# Patient Record
Sex: Male | Born: 1935 | Race: White | Hispanic: No | Smoking: Never smoker
Health system: Southern US, Community
[De-identification: ages and names within clinical notes are randomized; demographics above are authoritative.]

## PROBLEM LIST (undated history)

## (undated) DIAGNOSIS — C73 Malignant neoplasm of thyroid gland: Secondary | ICD-10-CM

## (undated) DIAGNOSIS — S0990XA Unspecified injury of head, initial encounter: Secondary | ICD-10-CM

## (undated) HISTORY — PX: FACIAL COSMETIC SURGERY: SHX629

## (undated) HISTORY — PX: APPENDECTOMY: SHX54

## (undated) HISTORY — DX: Unspecified injury of head, initial encounter: S09.90XA

## (undated) HISTORY — PX: THYROIDECTOMY: SHX17

## (undated) HISTORY — PX: CHOLECYSTECTOMY: SHX55

## (undated) HISTORY — DX: Malignant neoplasm of thyroid gland: C73

---

## 2007-06-11 ENCOUNTER — Emergency Department: Payer: Self-pay | Admitting: Unknown Physician Specialty

## 2007-08-09 ENCOUNTER — Ambulatory Visit: Payer: Self-pay | Admitting: Internal Medicine

## 2007-08-15 ENCOUNTER — Encounter: Payer: Self-pay | Admitting: Internal Medicine

## 2007-09-06 ENCOUNTER — Encounter: Payer: Self-pay | Admitting: Internal Medicine

## 2007-10-07 ENCOUNTER — Encounter: Payer: Self-pay | Admitting: Internal Medicine

## 2007-11-07 ENCOUNTER — Encounter: Payer: Self-pay | Admitting: Internal Medicine

## 2007-12-07 ENCOUNTER — Encounter: Payer: Self-pay | Admitting: Internal Medicine

## 2007-12-11 ENCOUNTER — Ambulatory Visit: Payer: Self-pay | Admitting: Family

## 2008-01-07 ENCOUNTER — Ambulatory Visit: Payer: Self-pay | Admitting: Family

## 2008-02-06 ENCOUNTER — Ambulatory Visit: Payer: Self-pay | Admitting: Family

## 2009-01-08 ENCOUNTER — Ambulatory Visit: Payer: Self-pay | Admitting: Internal Medicine

## 2009-02-14 ENCOUNTER — Ambulatory Visit: Payer: Self-pay | Admitting: Internal Medicine

## 2009-05-07 ENCOUNTER — Ambulatory Visit: Payer: Self-pay | Admitting: Neurology

## 2009-11-05 ENCOUNTER — Ambulatory Visit: Payer: Self-pay | Admitting: Internal Medicine

## 2009-11-12 ENCOUNTER — Encounter: Payer: Self-pay | Admitting: Physical Medicine and Rehabilitation

## 2009-12-01 ENCOUNTER — Emergency Department: Payer: Self-pay | Admitting: Emergency Medicine

## 2009-12-06 ENCOUNTER — Encounter: Payer: Self-pay | Admitting: Physical Medicine and Rehabilitation

## 2009-12-14 ENCOUNTER — Emergency Department: Payer: Self-pay | Admitting: Emergency Medicine

## 2010-01-16 ENCOUNTER — Emergency Department: Payer: Self-pay | Admitting: Emergency Medicine

## 2010-03-02 ENCOUNTER — Emergency Department: Payer: Self-pay | Admitting: Emergency Medicine

## 2010-03-02 ENCOUNTER — Encounter: Payer: Self-pay | Admitting: Cardiovascular Disease

## 2010-03-18 ENCOUNTER — Encounter: Payer: Self-pay | Admitting: Cardiovascular Disease

## 2010-03-24 ENCOUNTER — Encounter: Payer: Self-pay | Admitting: Cardiovascular Disease

## 2010-03-24 ENCOUNTER — Ambulatory Visit
Admission: RE | Admit: 2010-03-24 | Discharge: 2010-03-24 | Payer: Self-pay | Source: Home / Self Care | Attending: Cardiovascular Disease | Admitting: Cardiovascular Disease

## 2010-03-24 DIAGNOSIS — R072 Precordial pain: Secondary | ICD-10-CM | POA: Insufficient documentation

## 2010-04-09 NOTE — Assessment & Plan Note (Signed)
Summary:            Visit Type:  Initial Consult Primary Provider:  Dr. Ronna Polio  CC:  c/o occasional chest pain. Denies SOB and palpitations..  History of Present Illness: Mr. Espinoza is a very pleasant 77 year old gentleman with past history of stroke with residual right-sided deficits, diabetes, hypertension, hyperlipidemia, thyroid cancer with resection, additional TIAs who presents for followup after recent evaluation in the emergency room for chest pain.  He reports that on December 26 he was sitting in a chair and he developed left-sided discomfort over his chest. He never had symptoms like this before. He symptoms were quite significant, did not seem to get worse with exertion. By the time he got to the emergency room, they had almost resolved. He's not had any further episodes since his discharge from the emergency room. Notes indicate he had trouble lifting his left arm in addition to lifting his right. When he lifted his left, he had shoulder pain and scapular pain. EKG was essentially unchanged with heart rate 75 beats per minute, borderline Q waves in inferior leads and lateral leads. Blood work was essentially normal with negative cardiac enzymes.  He reports that he had a stress test with Dr. Juel Burrow. this was a treadmill study and he was told that it was normal. Since his stroke, he has not had any stress test. He is uncertain if he has had a carotid ultrasound after his stroke though reports significant workup at [facility].  EKG shows normal sinus rhythm with rate 90 beats per minute, borderline Q waves in anterolateral leads, essentially unchanged from previous EKG        Preventive Screening-Counseling & Management  Caffeine-Diet-Exercise     Does Patient Exercise: no      Drug Use:  no.    Current Medications (verified): 1)  Levothyroxine Sodium 100 Mcg Tabs (Levothyroxine Sodium) .Marland Kitchen.. 1 Tablet Daily 2)  Ramipril 2.5 Mg Caps (Ramipril) .Marland Kitchen.. 1 Tablet  Daily 3)  Simvastatin 10 Mg Tabs (Simvastatin) .Marland Kitchen.. 1 Tablet Daily 4)  Plavix 75 Mg Tabs (Clopidogrel Bisulfate) .Marland Kitchen.. 1 Tablet Daily 5)  Aspirin 325 Mg Tabs (Aspirin) .Marland Kitchen.. 1 Tablet Daily 6)  Nasonex 50 Mcg/act Susp (Mometasone Furoate) .Marland Kitchen.. 1 Spray Daily 7)  B Complex-B12  Tabs (B Complex Vitamins) .Marland Kitchen.. 1 Tablet Daily 8)  Vitamin C 100 Mg Tabs (Ascorbic Acid) .Marland Kitchen.. 1 Tablet Daily 9)  Vitamin C Cr 1000 Mg Cr-Tabs (Ascorbic Acid) .Marland Kitchen.. 1 Tablet Daily 10)  Vitamin D 400 Unit Tabs (Cholecalciferol) .Marland Kitchen.. 1 Tablet Daily 11)  Cinnamon 500 Mg Caps (Cinnamon) .Marland Kitchen.. 1 Tablet Daily 12)  Multivitamins  Tabs (Multiple Vitamin) .Marland Kitchen.. 1 Tablet Daily 13)  Fish Oil 1000 Mg Caps (Omega-3 Fatty Acids) .Marland Kitchen.. 1 Tablet Daily  Allergies (verified): 1)  ! Sulfa 2)  ! Penicillin  Past History:  Family History: Last updated: 2010/03/31 Father: Heart attack-deceased Mother: Cancer-deceased  Social History: Last updated: 2010/03/31 Tobacco Use - No.  Alcohol Use - no Regular Exercise - no Drug Use - no Retired  Widowed   Risk Factors: Exercise: no (03-31-2010)  Risk Factors: Smoking Status: never (03/20/2010)  Past Medical History: Diabetes  Past Surgical History: Thyroidectomy Appendectomy Cholecystectomy Facial surgery  Family History: Father: Heart attack-deceased Mother: Cancer-deceased  Social History: Tobacco Use - No.  Alcohol Use - no Regular Exercise - no Drug Use - no Retired  Widowed  Does Patient Exercise:  no Drug Use:  no  Review of Systems  The patient complains of difficulty walking.  The patient denies fever, weight loss, weight gain, vision loss, decreased hearing, hoarseness, chest pain, syncope, dyspnea on exertion, peripheral edema, prolonged cough, abdominal pain, incontinence, muscle weakness, depression, and enlarged lymph nodes.         Right side arm and leg weakness, mild speech impediment  Vital Signs:  Patient profile:   77 year old male Height:       67 inches Weight:      151.75 pounds BMI:     23.85 Pulse rate:   90 / minute BP sitting:   165 / 97  (left arm) Cuff size:   regular  Vitals Entered By: Lysbeth Galas CMA (March 24, 2010 3:35 PM)  Physical Exam  General:  elderly gentleman who walks slowly with a walker, no apparent distress Head:  normocephalic and atraumatic Neck:  Neck supple, no JVD. No masses, thyromegaly or abnormal cervical nodes. Lungs:  Clear bilaterally to auscultation and percussion. Heart:  Non-displaced PMI, chest non-tender; regular rate and rhythm, S1, S2 without murmurs, rubs or gallops. Carotid upstroke normal, no bruit. Normal abdominal aortic size, no bruits.  Pedals normal pulses. No edema, no varicosities. Abdomen:  Bowel sounds positive; abdomen soft and non-tender without masses Msk:  Back normal, normal gait. Muscle strength and tone normal. Pulses:  pulses normal in all 4 extremities Extremities:  No clubbing or cyanosis. Neurologic:  Alert and oriented x 3. Skin:  Intact without lesions or rashes. Psych:  Normal affect.   Impression & Recommendations:  Problem # 1:  CHEST PAIN-PRECORDIAL (ICD-786.51) one episode of chest pain on December 26 of uncertain etiology. EKG and blood work was essentially normal when seen in the emergency room. He did have a stress test in the recent past but the details are unavailable. We have suggested that we could perform a lexiscan Myoview if he would like either now or with additional episodes of chest discomfort. He talked with his daughter and he would like to hold off for now. We have suggested if he has additional episodes of chest pain, that he contact us for a stress test.  He is currently on aspirin and Plavix. I would recommend aggressive cholesterol management given his history of stroke and TIA. Blood pressure is well controlled on today's visit.  His updated medication list for this problem includes:    Ramipril 2.5 Mg Caps (Ramipril)  .Marland Kitchen... 1 tablet daily    Plavix 75 Mg Tabs (Clopidogrel bisulfate) .Marland Kitchen... 1 tablet daily    Aspirin 325 Mg Tabs (Aspirin) .Marland Kitchen... 1 tablet daily  Problem # 2:  PREVENTIVE HEALTH CARE (ICD-V70.0) Would recommend continued exercise to maintain his strength and independence. We have discussed this with him. He would try to continue exercises at the physical therapist and given to him. Continue aspirin and Plavix, cholesterol treatment.  Other Orders: EKG w/ Interpretation (93000)  Patient Instructions: 1)  Your physician recommends that you schedule a follow-up appointment in: 1 year 2)  Your physician recommends that you continue on your current medications as directed. Please refer to the Current Medication list given to you today.

## 2010-04-15 NOTE — Consult Note (Signed)
SummaryScientist, physiological [facility]   [facility]   Imported By: Roderic Ovens 04/10/2010 14:54:57  _____________________________________________________________________  External Attachment:    Type:   Image     Comment:   External Document

## 2010-04-15 NOTE — Letter (Signed)
SummarySecondary school teacher Office Visit Note   [facility] Office Visit Note   Imported By: Roderic Ovens 04/10/2010 14:25:47  _____________________________________________________________________  External Attachment:    Type:   Image     Comment:   External Document

## 2010-04-15 NOTE — Letter (Signed)
SummaryScientist, physiological [facility]   [facility]   Imported By: Roderic Ovens 04/10/2010 15:06:11  _____________________________________________________________________  External Attachment:    Type:   Image     Comment:   External Document

## 2010-09-23 ENCOUNTER — Encounter: Payer: Self-pay | Admitting: Cardiovascular Disease

## 2010-10-27 ENCOUNTER — Encounter: Payer: Self-pay | Admitting: Internal Medicine

## 2010-10-28 ENCOUNTER — Ambulatory Visit (INDEPENDENT_AMBULATORY_CARE_PROVIDER_SITE_OTHER): Payer: Medicare Other | Admitting: Internal Medicine

## 2010-10-28 ENCOUNTER — Encounter: Payer: Self-pay | Admitting: Internal Medicine

## 2010-10-28 VITALS — BP 157/89 | HR 68 | Temp 97.9°F | Resp 12 | Ht 68.0 in | Wt 154.0 lb

## 2010-10-28 DIAGNOSIS — I10 Essential (primary) hypertension: Secondary | ICD-10-CM

## 2010-10-28 DIAGNOSIS — R296 Repeated falls: Secondary | ICD-10-CM

## 2010-10-28 DIAGNOSIS — E119 Type 2 diabetes mellitus without complications: Secondary | ICD-10-CM | POA: Insufficient documentation

## 2010-10-28 DIAGNOSIS — Z9181 History of falling: Secondary | ICD-10-CM

## 2010-10-28 DIAGNOSIS — E785 Hyperlipidemia, unspecified: Secondary | ICD-10-CM

## 2010-10-28 DIAGNOSIS — I7090 Unspecified atherosclerosis: Secondary | ICD-10-CM | POA: Insufficient documentation

## 2010-10-28 MED ORDER — CLOPIDOGREL BISULFATE 75 MG PO TABS: 75.0000 mg | ORAL_TABLET | Freq: Every day | ORAL | Status: DC

## 2010-10-28 MED ORDER — METFORMIN HCL 500 MG PO TABS: 500.0000 mg | ORAL_TABLET | Freq: Two times a day (BID) | ORAL | Status: DC

## 2010-10-28 MED ORDER — RAMIPRIL 2.5 MG PO CAPS: 2.5000 mg | ORAL_CAPSULE | Freq: Every day | ORAL | Status: DC

## 2010-10-28 MED ORDER — SIMVASTATIN 10 MG PO TABS
10.0000 mg | ORAL_TABLET | Freq: Every day | ORAL | Status: DC
Start: 2010-10-28 — End: 2011-11-23

## 2010-10-28 NOTE — Patient Instructions (Signed)
Follow up in 3 months and earlier if needed.

## 2010-10-28 NOTE — Progress Notes (Signed)
Subjective:    Patient ID: Bradley Marshall, male    DOB: 07-04-1928, 77 y.o.   MRN: [id number]  HPI Bradley Marshall is a 77 year old male with a history stroke resulting in right sided weakness leading to recurrent falls. Since his last visit, during which time he leaned over to pick up something off the floor and fell forward hitting his head and leg on the ground. He did not sustain any injuries from the fall.  His family members have made efforts to prevent falls in his home by limiting obstacles and providing him with a Bradley Marshall. He also has an emergency alert device which he wears around his neck.  He also presents to followup diabetes. He continues to take metformin for his diabetes and reports good compliance with his medication.  He does not check his blood sugars on a regular basis. His daughter checked his blood sugar one time since her last visit and is 139. He denies any symptoms such as diaphoresis or lightheadedness to suggest low blood sugars.  Outpatient Encounter Prescriptions as of 10/28/2010  Medication Sig Dispense Refill  . aspirin 325 MG tablet Take 325 mg by mouth daily.        . B Complex Vitamins (B COMPLEX-B12) TABS Take by mouth daily.        . Cinnamon 500 MG capsule Take 500 mg by mouth daily.        Marland Kitchen levothyroxine (SYNTHROID, LEVOTHROID) 75 MCG tablet Take 75 mcg by mouth daily.        . metFORMIN (GLUCOPHAGE) 500 MG tablet Take 1 tablet (500 mg total) by mouth 2 (two) times daily.  180 tablet  4  . mometasone (NASONEX) 50 MCG/ACT nasal spray Place 1 spray into the nose daily.        . Multiple Vitamins-Minerals (MULTIVITAL) tablet Take 1 tablet by mouth daily.        . Omega-3 Fatty Acids (FISH OIL) 1000 MG CAPS Take by mouth daily.        . ramipril (ALTACE) 2.5 MG capsule Take 1 capsule (2.5 mg total) by mouth daily.  90 capsule  4  . simvastatin (ZOCOR) 10 MG tablet Take 1 tablet (10 mg total) by mouth daily.  90 tablet  4  . vitamin D, CHOLECALCIFEROL, 400 UNITS tablet  Take 400 Units by mouth daily.        Marland Kitchen DISCONTD: clopidogrel (PLAVIX) 75 MG tablet Take 75 mg by mouth daily.        Marland Kitchen DISCONTD: metFORMIN (GLUCOPHAGE) 500 MG tablet Take 500 mg by mouth Twice daily.      Marland Kitchen DISCONTD: ramipril (ALTACE) 2.5 MG capsule Take 2.5 mg by mouth daily.        Marland Kitchen DISCONTD: simvastatin (ZOCOR) 10 MG tablet Take 10 mg by mouth daily.        . Ascorbic Acid (VITAMIN C) 100 MG tablet Take 100 mg by mouth daily.        . clopidogrel (PLAVIX) 75 MG tablet Take 1 tablet (75 mg total) by mouth daily.  90 tablet  4  . DISCONTD: Ascorbic Acid (VITAMIN C CR) 1000 MG TBCR Take by mouth daily.        Marland Kitchen DISCONTD: clopidogrel (PLAVIX) 75 MG tablet Take 75 mg by mouth Daily.      Marland Kitchen DISCONTD: Levothyroxine Sodium 100 MCG CAPS Take by mouth daily.           Review of Systems  Constitutional: Negative for fever, chills, activity  change, appetite change, fatigue and unexpected weight change.  Eyes: Negative for visual disturbance.  Respiratory: Negative for cough and shortness of breath.   Cardiovascular: Negative for chest pain, palpitations and leg swelling.  Gastrointestinal: Negative for abdominal pain and abdominal distention.  Genitourinary: Negative for dysuria, urgency and difficulty urinating.  Musculoskeletal: Positive for gait problem. Negative for myalgias and arthralgias.  Skin: Negative for color change and rash.  Neurological: Positive for speech difficulty and weakness (right sided).  Hematological: Negative for adenopathy.  Psychiatric/Behavioral: Negative for confusion, sleep disturbance, dysphoric mood and decreased concentration. The patient is not nervous/anxious.     BP 157/89  Pulse 68  Temp(Src) 97.9 F (36.6 C) (Oral)  Resp 12  Ht 5\' 8"  (1.727 m)  Wt 154 lb (69.854 kg)  BMI 23.42 kg/m2  SpO2 98%     Objective:   Physical Exam  Constitutional: He is oriented to person, place, and time. He appears well-developed and well-nourished. No distress.    HENT:  Head: Normocephalic and atraumatic.  Nose: Nose normal.  Mouth/Throat: Oropharynx is clear and moist. No oropharyngeal exudate.  Eyes: Conjunctivae and EOM are normal. Pupils are equal, round, and reactive to light. Right eye exhibits no discharge. Left eye exhibits no discharge. No scleral icterus.  Neck: Normal range of motion. Neck supple. No tracheal deviation present. No thyromegaly present.  Cardiovascular: Normal rate, regular rhythm and normal heart sounds.  Exam reveals no gallop and no friction rub.   No murmur heard. Pulmonary/Chest: Effort normal and breath sounds normal. No respiratory distress. He has no wheezes. He has no rales. He exhibits no tenderness.  Musculoskeletal: Normal range of motion. He exhibits no edema.       Decreased strength RUE   Lymphadenopathy:    He has no cervical adenopathy.  Neurological: He is alert and oriented to person, place, and time. No cranial nerve deficit. Coordination and gait abnormal.  Skin: Skin is warm and dry. No rash noted. He is not diaphoretic. There is erythema (right lateral knee c/w ecchymoses). No pallor.  Psychiatric: He has a normal mood and affect. His behavior is normal. Judgment and thought content normal.          Assessment & Plan:  1. Recurrent falls - Bradley Marshall has a history of stroke which has led to right-sided weakness and recurrent falls. He has done fairly well recently with only one fall from a standing position with no resulting injury. His family has made great efforts to ensure that his home is obstacle free and he uses a Bradley Marshall to help prevent falls. He has had physical and occupational therapy in the past. He refuses additional therapy at this time. We will continue to monitor for now. He will continue to use his Bradley Marshall and keep his emergency alert device in place.  2. Diabetes - Well-controlled on current medications. Will check hemoglobin A1c with labs. We'll request recent labs from [facility]. We'll plan for him to followup in 3 months.  3. Hyperlipidemia - well-controlled on simvastatin 10 mg daily. Will continue this medication and repeat liver function tests and lipids in 3 months.

## 2010-11-24 ENCOUNTER — Telehealth: Payer: Self-pay | Admitting: Internal Medicine

## 2010-11-24 NOTE — Telephone Encounter (Signed)
Please set him up with [facility] for evaluation and likely physical therapy. Thanks

## 2010-11-24 NOTE — Telephone Encounter (Signed)
Pt daughter called she would like to get Bradley Marshall set up with home health care.  He is falling more.  He is falling at night when no one is there with him

## 2010-11-24 NOTE — Telephone Encounter (Signed)
Called Sherrilyn Rist and she is going to stop by tomorrow to pickup paperwork.

## 2010-11-27 ENCOUNTER — Emergency Department: Payer: Self-pay | Admitting: Internal Medicine

## 2010-12-01 ENCOUNTER — Telehealth: Payer: Self-pay | Admitting: Internal Medicine

## 2010-12-01 NOTE — Telephone Encounter (Signed)
Physical Therapist from [facility] called he needs orders to continue 2x  A week for another 2-3 weeks.  Please advise.

## 2010-12-01 NOTE — Telephone Encounter (Signed)
That is fine 

## 2010-12-02 NOTE — Telephone Encounter (Signed)
Informed Physical Therapist that it was fine to continue physical therapy.  He just needed a verbal.

## 2011-01-18 ENCOUNTER — Ambulatory Visit (INDEPENDENT_AMBULATORY_CARE_PROVIDER_SITE_OTHER): Payer: Medicare Other | Admitting: Internal Medicine

## 2011-01-18 ENCOUNTER — Telehealth: Payer: Self-pay | Admitting: Internal Medicine

## 2011-01-18 ENCOUNTER — Ambulatory Visit: Payer: Self-pay | Admitting: Internal Medicine

## 2011-01-18 ENCOUNTER — Encounter: Payer: Self-pay | Admitting: Internal Medicine

## 2011-01-18 VITALS — BP 136/66 | HR 91 | Temp 97.7°F | Wt 158.0 lb

## 2011-01-18 DIAGNOSIS — E785 Hyperlipidemia, unspecified: Secondary | ICD-10-CM

## 2011-01-18 DIAGNOSIS — I672 Cerebral atherosclerosis: Secondary | ICD-10-CM

## 2011-01-18 DIAGNOSIS — E039 Hypothyroidism, unspecified: Secondary | ICD-10-CM

## 2011-01-18 DIAGNOSIS — R0989 Other specified symptoms and signs involving the circulatory and respiratory systems: Secondary | ICD-10-CM

## 2011-01-18 DIAGNOSIS — I509 Heart failure, unspecified: Secondary | ICD-10-CM

## 2011-01-18 DIAGNOSIS — R05 Cough: Secondary | ICD-10-CM

## 2011-01-18 DIAGNOSIS — I10 Essential (primary) hypertension: Secondary | ICD-10-CM

## 2011-01-18 DIAGNOSIS — R059 Cough, unspecified: Secondary | ICD-10-CM

## 2011-01-18 DIAGNOSIS — E119 Type 2 diabetes mellitus without complications: Secondary | ICD-10-CM

## 2011-01-18 DIAGNOSIS — R0600 Dyspnea, unspecified: Secondary | ICD-10-CM

## 2011-01-18 NOTE — Progress Notes (Signed)
Subjective:    Patient ID: Bradley Marshall, male    DOB: 02-01-1929, 77 y.o.   MRN: [id number]  HPI 77YO male with h/o stroke presents for acute visit c/o several days of dyspnea and wheezing. Non-productive cough. No congestion, sore throat, fever, chills.  Notes recent weight gain of about 5lbs.  No fever, chills. No known sick contacts. No chest pain or palpitations.  Daughter also notes some progression of right arm weakness and possible left arm weakness. They question whether he might be having progression of stroke or new stroke. 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  Outpatient Encounter Prescriptions as of 01/18/2011  Medication Sig Dispense Refill  . Ascorbic Acid (VITAMIN C) 100 MG tablet Take 100 mg by mouth daily.        Marland Kitchen aspirin 325 MG tablet Take 325 mg by mouth daily.        . B Complex Vitamins (B COMPLEX-B12) TABS Take by mouth daily.        . Cinnamon 500 MG capsule Take 500 mg by mouth daily.        . clopidogrel (PLAVIX) 75 MG tablet Take 1 tablet (75 mg total) by mouth daily.  90 tablet  4  . levothyroxine (SYNTHROID, LEVOTHROID) 75 MCG tablet Take 75 mcg by mouth daily.        . metFORMIN (GLUCOPHAGE) 500 MG tablet Take 1 tablet (500 mg total) by mouth 2 (two) times daily.  180 tablet  4  . mometasone (NASONEX) 50 MCG/ACT nasal spray Place 1 spray into the nose daily.        . Multiple Vitamins-Minerals (MULTIVITAL) tablet Take 1 tablet by mouth daily.        . Omega-3 Fatty Acids (FISH OIL) 1000 MG CAPS Take by mouth daily.        . ramipril (ALTACE) 2.5 MG capsule Take 1 capsule (2.5 mg total) by mouth daily.  90 capsule  4  . simvastatin (ZOCOR) 10 MG tablet Take 1 tablet (10 mg total) by mouth daily.  90 tablet  4  . vitamin D, CHOLECALCIFEROL, 400 UNITS tablet Take 400 Units by mouth daily.          Review of Systems  Constitutional: Negative for fever, chills, activity change, appetite change, fatigue and unexpected weight change.  Eyes:  Negative for visual disturbance.  Respiratory: Positive for shortness of breath and wheezing. Negative for cough.   Cardiovascular: Negative for chest pain, palpitations and leg swelling.  Gastrointestinal: Negative for abdominal pain and abdominal distention.  Genitourinary: Negative for dysuria, urgency and difficulty urinating.  Musculoskeletal: Positive for gait problem. Negative for arthralgias.  Skin: Negative for color change and rash.  Neurological: Positive for weakness.  Hematological: Negative for adenopathy.  Psychiatric/Behavioral: Negative for sleep disturbance and dysphoric mood. The patient is not nervous/anxious.    BP 136/66  Pulse 91  Temp(Src) 97.7 F (36.5 C) (Oral)  Wt 158 lb (71.668 kg)  SpO2 97%     Objective:   Physical Exam  Constitutional: He is oriented to person, place, and time. He appears well-developed and well-nourished. No distress.  HENT:  Head: Normocephalic and atraumatic.  Right Ear: External ear normal.  Left Ear: External ear normal.  Nose: Nose normal.  Mouth/Throat: Oropharynx is clear and moist. No oropharyngeal exudate.  Eyes: Conjunctivae and EOM are normal. Pupils are equal, round, and reactive to light. Right eye exhibits no discharge. Left eye exhibits no discharge. No scleral icterus.  Neck: Normal range of motion. Neck supple. No tracheal deviation present. No thyromegaly present.  Cardiovascular: Normal rate, regular rhythm and normal heart sounds.  Exam reveals no gallop and no friction rub.   No murmur heard. Pulmonary/Chest: Effort normal. No respiratory distress. He has no wheezes. He has rales (bilateral bases). He exhibits no tenderness.  Musculoskeletal: Normal range of motion. He exhibits no edema.  Lymphadenopathy:    He has no cervical adenopathy.  Neurological: He is alert and oriented to person, place, and time. No cranial nerve deficit. He exhibits abnormal muscle tone (RUE weakness). Coordination normal.  Skin: Skin  is warm and dry. No rash noted. He is not diaphoretic. No erythema. No pallor.  Psychiatric: He has a normal mood and affect. His behavior is normal. Judgment and thought content normal.    CXR: no acute changes      Assessment & Plan:  1. Dyspnea/Cough - Exam is consistent with pulmonary edema.  No fever, congestion, etc to suggest URI. CXR normal. Will try adding Lasix 20mg  daily x 2 days, then recheck.  Follow up 2 days.  2. Cerebral atherosclerosis - Therapy maximized with plavix, aspirin, statin. BP has been well controlled. Will check LDL to make sure at goal <70. DM has been well controlled. Will check A1c with labs today. Discussed possible imaging with daughter. We discussed additional intervention of warfarin, and risks associated with this.  He has had numerous falls, so risk of bleeding on coumadin would be high. For now, will continue current therapy and continue to monitor.

## 2011-01-18 NOTE — Telephone Encounter (Signed)
CXR showed no evidence of infection. I think his wheezing is fluid. Would like to start Lasix 20mg  daily x 2 days. Monitor weight closely. Follow up Thursday.

## 2011-01-18 NOTE — Patient Instructions (Signed)
CXR today. Labs today. We will call with results. Follow up Thursday.

## 2011-01-19 LAB — LIPID PANEL
LDL Cholesterol: 74 mg/dL (ref 0–99)
Total CHOL/HDL Ratio: 3
VLDL: 18.2 mg/dL (ref 0.0–40.0)

## 2011-01-19 LAB — COMPREHENSIVE METABOLIC PANEL
AST: 18 U/L (ref 0–37)
Albumin: 3.8 g/dL (ref 3.5–5.2)
Alkaline Phosphatase: 71 U/L (ref 39–117)
Potassium: 4 mEq/L (ref 3.5–5.1)
Sodium: 140 mEq/L (ref 135–145)
Total Protein: 6.9 g/dL (ref 6.0–8.3)

## 2011-01-19 MED ORDER — FUROSEMIDE 20 MG PO TABS: ORAL_TABLET | ORAL | Status: DC

## 2011-01-19 NOTE — Telephone Encounter (Signed)
Daughter informed, rx sent into local pharm

## 2011-01-19 NOTE — Telephone Encounter (Signed)
Left mess for daughter to call office back.

## 2011-01-20 ENCOUNTER — Other Ambulatory Visit: Payer: Self-pay | Admitting: *Deleted

## 2011-01-20 MED ORDER — LEVOTHYROXINE SODIUM 100 MCG PO TABS: 100.0000 ug | ORAL_TABLET | Freq: Every day | ORAL | Status: DC

## 2011-01-21 ENCOUNTER — Encounter: Payer: Self-pay | Admitting: Internal Medicine

## 2011-01-21 ENCOUNTER — Ambulatory Visit (INDEPENDENT_AMBULATORY_CARE_PROVIDER_SITE_OTHER): Payer: Medicare Other | Admitting: Internal Medicine

## 2011-01-21 VITALS — BP 130/70 | HR 98 | Temp 98.0°F | Wt 161.0 lb

## 2011-01-21 DIAGNOSIS — E039 Hypothyroidism, unspecified: Secondary | ICD-10-CM

## 2011-01-21 DIAGNOSIS — J40 Bronchitis, not specified as acute or chronic: Secondary | ICD-10-CM

## 2011-01-21 MED ORDER — AZITHROMYCIN 250 MG PO TABS: ORAL_TABLET | ORAL | Status: AC

## 2011-01-21 NOTE — Patient Instructions (Signed)
Stop Lasix. Start Azithromycin. Call Monday to give update in condition.

## 2011-01-21 NOTE — Progress Notes (Signed)
Subjective:    Patient ID: Bradley Marshall, male    DOB: 02-02-1929, 77 y.o.   MRN: [id number]  HPI 77YO male with h/o hypothyroidism and recent 2 week h/o cough presents for follow up. CXR after visit earlier this week was normal. Pt was treated with lasix for suspected pulm edema, however cough unchanged. Cough is non-productive. No fever, chills. Generally weak. No sick contacts. Labs at last visit also showed elevated TSH. Synthroid was increased to daily, but pt has not started yet.  Outpatient Encounter Prescriptions as of 01/21/2011  Medication Sig Dispense Refill  . Ascorbic Acid (VITAMIN C) 100 MG tablet Take 100 mg by mouth daily.        Marland Kitchen aspirin 325 MG tablet Take 325 mg by mouth daily.        . B Complex Vitamins (B COMPLEX-B12) TABS Take by mouth daily.        . Cinnamon 500 MG capsule Take 500 mg by mouth daily.        . clopidogrel (PLAVIX) 75 MG tablet Take 1 tablet (75 mg total) by mouth daily.  90 tablet  4  . furosemide (LASIX) 20 MG tablet Take 1 tablet daily x 2 days, then discuss with physician at visit  30 tablet  0  . levothyroxine (LEVOTHROID) 100 MCG tablet Take 1 tablet (100 mcg total) by mouth daily.  90 tablet  1  . metFORMIN (GLUCOPHAGE) 500 MG tablet Take 1 tablet (500 mg total) by mouth 2 (two) times daily.  180 tablet  4  . mometasone (NASONEX) 50 MCG/ACT nasal spray Place 1 spray into the nose daily.        . Multiple Vitamins-Minerals (MULTIVITAL) tablet Take 1 tablet by mouth daily.        . Omega-3 Fatty Acids (FISH OIL) 1000 MG CAPS Take by mouth daily.        . ramipril (ALTACE) 2.5 MG capsule Take 1 capsule (2.5 mg total) by mouth daily.  90 capsule  4  . simvastatin (ZOCOR) 10 MG tablet Take 1 tablet (10 mg total) by mouth daily.  90 tablet  4  . vitamin D, CHOLECALCIFEROL, 400 UNITS tablet Take 400 Units by mouth daily.          Review of Systems  Constitutional: Negative for fever, chills, activity change, appetite change, fatigue and  unexpected weight change.  Eyes: Negative for visual disturbance.  Respiratory: Positive for cough. Negative for shortness of breath.   Cardiovascular: Negative for chest pain, palpitations and leg swelling.  Gastrointestinal: Negative for abdominal pain and abdominal distention.  Genitourinary: Negative for dysuria, urgency and difficulty urinating.  Musculoskeletal: Negative for arthralgias and gait problem.  Skin: Negative for color change and rash.  Neurological: Positive for weakness.  Hematological: Negative for adenopathy.  Psychiatric/Behavioral: Negative for sleep disturbance and dysphoric mood. The patient is not nervous/anxious.    BP 130/70  Pulse 98  Temp(Src) 98 F (36.7 C) (Oral)  Wt 161 lb (73.029 kg)  SpO2 96%     Objective:   Physical Exam  Constitutional: He is oriented to person, place, and time. He appears well-developed and well-nourished. No distress.  HENT:  Head: Normocephalic and atraumatic.  Right Ear: External ear normal.  Left Ear: External ear normal.  Nose: Nose normal.  Mouth/Throat: Oropharynx is clear and moist. No oropharyngeal exudate.  Eyes: Conjunctivae and EOM are normal. Pupils are equal, round, and reactive to light. Right eye exhibits no discharge. Left eye exhibits  no discharge. No scleral icterus.  Neck: Normal range of motion. Neck supple. No tracheal deviation present. No thyromegaly present.  Cardiovascular: Normal rate, regular rhythm and normal heart sounds.  Exam reveals no gallop and no friction rub.   No murmur heard. Pulmonary/Chest: Effort normal. No accessory muscle usage. Not tachypneic. No respiratory distress. He has no decreased breath sounds. He has no wheezes. He has rhonchi in the left middle field and the left lower field. He has no rales. He exhibits no tenderness.  Musculoskeletal: Normal range of motion. He exhibits no edema.  Lymphadenopathy:    He has no cervical adenopathy.  Neurological: He is alert and  oriented to person, place, and time. No cranial nerve deficit. Coordination normal.       Chronic weakness right upper extremity and right leg 2/2 stroke  Skin: Skin is warm and dry. No rash noted. He is not diaphoretic. No erythema. No pallor.  Psychiatric: He has a normal mood and affect. His behavior is normal. Judgment and thought content normal.          Assessment & Plan:  1. Bronchitis - Exam improved compared to earlier this week. CXR was normal. Suspect combination of pulmonary edema, which is now improved after lasix, and bronchitis. Given focal crackles LLL, will start azithromycin. Pt daughter will call with update on Monday. Follow up 2 weeks.  2. Hypothyroidism- TSH elevated at 7. Synthroid increased to daily. Repeat TSH in 1 month.

## 2011-01-27 ENCOUNTER — Encounter: Payer: Self-pay | Admitting: Internal Medicine

## 2011-02-04 ENCOUNTER — Ambulatory Visit: Payer: Medicare Other | Admitting: Internal Medicine

## 2011-03-17 ENCOUNTER — Ambulatory Visit: Payer: Medicare Other | Admitting: Internal Medicine

## 2011-03-23 ENCOUNTER — Ambulatory Visit (INDEPENDENT_AMBULATORY_CARE_PROVIDER_SITE_OTHER): Payer: Medicare Other | Admitting: Internal Medicine

## 2011-03-23 ENCOUNTER — Encounter: Payer: Self-pay | Admitting: Internal Medicine

## 2011-03-23 ENCOUNTER — Ambulatory Visit (INDEPENDENT_AMBULATORY_CARE_PROVIDER_SITE_OTHER)
Admission: RE | Admit: 2011-03-23 | Discharge: 2011-03-23 | Disposition: A | Discharge status: Home / Self Care | Payer: Medicare Other | Source: Ambulatory Visit | Attending: Internal Medicine | Admitting: Internal Medicine

## 2011-03-23 VITALS — BP 130/82 | HR 106 | Temp 97.6°F | Wt 163.0 lb

## 2011-03-23 DIAGNOSIS — M6281 Muscle weakness (generalized): Secondary | ICD-10-CM

## 2011-03-23 DIAGNOSIS — R05 Cough: Secondary | ICD-10-CM

## 2011-03-23 DIAGNOSIS — R0609 Other forms of dyspnea: Secondary | ICD-10-CM

## 2011-03-23 DIAGNOSIS — R531 Weakness: Secondary | ICD-10-CM

## 2011-03-23 DIAGNOSIS — R197 Diarrhea, unspecified: Secondary | ICD-10-CM

## 2011-03-23 DIAGNOSIS — R0600 Dyspnea, unspecified: Secondary | ICD-10-CM

## 2011-03-23 DIAGNOSIS — D510 Vitamin B12 deficiency anemia due to intrinsic factor deficiency: Secondary | ICD-10-CM

## 2011-03-23 DIAGNOSIS — R0989 Other specified symptoms and signs involving the circulatory and respiratory systems: Secondary | ICD-10-CM

## 2011-03-23 LAB — TSH: TSH: 0.2 u[IU]/mL — ABNORMAL LOW (ref 0.35–5.50)

## 2011-03-23 LAB — COMPREHENSIVE METABOLIC PANEL
AST: 21 U/L (ref 0–37)
Albumin: 4 g/dL (ref 3.5–5.2)
Alkaline Phosphatase: 75 U/L (ref 39–117)
Potassium: 3.5 mEq/L (ref 3.5–5.1)
Sodium: 139 mEq/L (ref 135–145)
Total Protein: 7.2 g/dL (ref 6.0–8.3)

## 2011-03-23 LAB — CBC WITH DIFFERENTIAL/PLATELET
Eosinophils Absolute: 0.3 10*3/uL (ref 0.0–0.7)
MCHC: 34.1 g/dL (ref 30.0–36.0)
MCV: 86.1 fl (ref 78.0–100.0)
Monocytes Absolute: 0.4 10*3/uL (ref 0.1–1.0)
Neutrophils Relative %: 62.3 % (ref 43.0–77.0)
Platelets: 208 10*3/uL (ref 150.0–400.0)

## 2011-03-23 LAB — VITAMIN B12: Vitamin B-12: 1398 pg/mL — ABNORMAL HIGH (ref 211–911)

## 2011-03-23 NOTE — Patient Instructions (Signed)
HOLD Metformin.  Labs today and chest xray today.

## 2011-03-23 NOTE — Progress Notes (Signed)
Subjective:    Patient ID: Bradley Marshall, male    DOB: 06/01/1928, 78 y.o.   MRN: [id number]  HPI 78 year old male with a history of stroke and hypothyroidism presents for an acute visit complaining of a two-week history of watery diarrhea. His daughter's report that he has watery diarrhea on a daily basis. It does not seem to be affected by what he is eating. Occasionally, he has accidents when he is unable to make it to the restroom. They deny any blood in his stool. He denies any abdominal pain. He denies any fever or chills. He does not have any known sick contacts. He has not had any nausea or vomiting.  They note that with the onset of diarrhea he has become increasingly weak. He has chronic weakness on his left side after having a stroke, however the reports this is much worse over the last several days. They have also noticed some drooping on his right side of his face. He reports some dizziness described as lightheadedness associated with the diarrhea. He has not had any falls. He has not had any known head injuries.  He also has some chronic shortness of breath. They deny any cough. He denies any chest pain. He denies any palpitations.  Outpatient Encounter Prescriptions as of 03/23/2011  Medication Sig Dispense Refill  . Ascorbic Acid (VITAMIN C) 100 MG tablet Take 100 mg by mouth daily.        Marland Kitchen aspirin 325 MG tablet Take 325 mg by mouth daily.        . B Complex Vitamins (B COMPLEX-B12) TABS Take by mouth daily.        . Cinnamon 500 MG capsule Take 500 mg by mouth daily.        . clopidogrel (PLAVIX) 75 MG tablet Take 1 tablet (75 mg total) by mouth daily.  90 tablet  4  . furosemide (LASIX) 20 MG tablet Take 1 tablet daily x 2 days, then discuss with physician at visit  30 tablet  0  . levothyroxine (LEVOTHROID) 100 MCG tablet Take 1 tablet (100 mcg total) by mouth daily.  90 tablet  1  . mometasone (NASONEX) 50 MCG/ACT nasal spray Place 1 spray into the nose daily.        .  Multiple Vitamins-Minerals (MULTIVITAL) tablet Take 1 tablet by mouth daily.        . Omega-3 Fatty Acids (FISH OIL) 1000 MG CAPS Take by mouth daily.        . ramipril (ALTACE) 2.5 MG capsule Take 1 capsule (2.5 mg total) by mouth daily.  90 capsule  4  . simvastatin (ZOCOR) 10 MG tablet Take 1 tablet (10 mg total) by mouth daily.  90 tablet  4  . vitamin D, CHOLECALCIFEROL, 400 UNITS tablet Take 400 Units by mouth daily.          Review of Systems  Constitutional: Negative for fever, chills, activity change, appetite change, fatigue and unexpected weight change.  Eyes: Negative for visual disturbance.  Respiratory: Negative for cough and shortness of breath.   Cardiovascular: Negative for chest pain, palpitations and leg swelling.  Gastrointestinal: Positive for diarrhea. Negative for abdominal pain and abdominal distention.  Genitourinary: Negative for dysuria, urgency and difficulty urinating.  Musculoskeletal: Negative for arthralgias and gait problem.  Skin: Negative for color change and rash.  Neurological: Positive for dizziness, tremors, weakness and light-headedness.  Hematological: Negative for adenopathy.  Psychiatric/Behavioral: Negative for sleep disturbance and dysphoric mood. The patient is  not nervous/anxious.    BP 130/82  Pulse 106  Temp(Src) 97.6 F (36.4 C) (Oral)  Wt 163 lb (73.936 kg)  SpO2 94%     Objective:   Physical Exam  Constitutional: He is oriented to person, place, and time. He appears well-developed and well-nourished. No distress.  HENT:  Head: Normocephalic and atraumatic.  Right Ear: External ear normal.  Left Ear: External ear normal.  Nose: Nose normal.  Mouth/Throat: Oropharynx is clear and moist. No oropharyngeal exudate.  Eyes: Conjunctivae and EOM are normal. Pupils are equal, round, and reactive to light. Right eye exhibits no discharge. Left eye exhibits no discharge. No scleral icterus.  Neck: Normal range of motion. Neck supple. No  tracheal deviation present. No thyromegaly present.  Cardiovascular: Normal rate, regular rhythm and normal heart sounds.  Exam reveals no gallop and no friction rub.   No murmur heard. Pulmonary/Chest: Effort normal and breath sounds normal. No respiratory distress. He has no wheezes. He has no rales. He exhibits no tenderness.  Abdominal: Soft. Bowel sounds are normal. He exhibits no distension and no mass. There is no tenderness. There is no rebound and no guarding.  Musculoskeletal: He exhibits no edema.  Lymphadenopathy:    He has no cervical adenopathy.  Neurological: He is alert and oriented to person, place, and time. No cranial nerve deficit. He exhibits abnormal muscle tone (left sided weakness). Coordination abnormal.  Skin: Skin is warm and dry. No rash noted. He is not diaphoretic. No erythema. No pallor.  Psychiatric: He has a normal mood and affect. His behavior is normal. Judgment and thought content normal.          Assessment & Plan:  1. Diarrhea - description does not sound like infectious in origin, however will send stool for culture. Will also check renal function and to look for dehydration. We'll have them stop metformin. Will check thyroid function as hyperthyroidism can lead to diarrhea. Encouraged his daughter's to increase his fluid intake. We'll have him followup in 2 weeks.  2. Weakness - suspect this is secondary to dehydration however, given his progressive left-sided weakness and right-sided facial droop question male have had additional CVA. Discussed the option of getting an MRI brain with his family. They would like to proceed with this. Will schedule MRI brain for this week. He is on statin and Plavix. Will continue these medications.  3. Dyspnea - patient describes dyspnea on exertion. His exam is normal. Suspect this may be secondary to deconditioning. Will get chest x-ray today for additional evaluation. We'll consider cardiac workup if chest x-ray is  normal and dyspnea persist.

## 2011-03-24 ENCOUNTER — Ambulatory Visit: Payer: Self-pay | Admitting: Internal Medicine

## 2011-03-24 LAB — CREATININE, SERUM: EGFR (Non-African Amer.): >60

## 2011-03-25 ENCOUNTER — Telehealth: Payer: Self-pay | Admitting: Internal Medicine

## 2011-03-25 NOTE — Telephone Encounter (Signed)
Patient notified

## 2011-03-25 NOTE — Telephone Encounter (Signed)
MRI brain showed no new areas of stroke.

## 2011-03-26 ENCOUNTER — Telehealth: Payer: Self-pay | Admitting: *Deleted

## 2011-03-26 NOTE — Telephone Encounter (Signed)
Daughter informed of MRI results

## 2011-04-05 ENCOUNTER — Telehealth: Payer: Self-pay | Admitting: *Deleted

## 2011-04-05 MED ORDER — GLIPIZIDE 2.5 MG HALF TABLET: 2.5000 mg | ORAL_TABLET | Freq: Every day | ORAL | Status: DC

## 2011-04-05 NOTE — Telephone Encounter (Signed)
Pt has been off metformin since his last office visit here, he was told to stop it because of diarrhea.  His blood sugar on Friday fasting was 133. Today, about 45 minutes after eating it was 258 and about 10 minutes later it was 280.  Pt is not on any other diabetes medicine.  Should he start back on metformin, or change to something else.  Uses [facility] in [place].  Please call daughter Elita Quick at [phone] with instructions.

## 2011-04-05 NOTE — Telephone Encounter (Signed)
I called in Glipizide 2.5mg  daily for him to try.  He should take in the morning.  They will need to monitor BG very carefully as this can lower sugars. He should stay off Metformin.

## 2011-04-05 NOTE — Telephone Encounter (Signed)
Advised pt's daughter, Elita Quick.

## 2011-04-06 ENCOUNTER — Telehealth: Payer: Self-pay | Admitting: *Deleted

## 2011-04-06 MED ORDER — GLIPIZIDE ER 2.5 MG PO TB24: 2.5000 mg | ORAL_TABLET | Freq: Every day | ORAL | Status: DC

## 2011-04-06 NOTE — Telephone Encounter (Signed)
[facility] called, You sent in RX for glipizide 1/2 tab once daily - glipizide 2.5 mg only comes in extended release and can not be cut in half. Ok for 1/4 of 5 mg? Or do you want pt to try 2.5 mg full pill?

## 2011-04-06 NOTE — Telephone Encounter (Signed)
New rx sent in

## 2011-04-06 NOTE — Telephone Encounter (Signed)
2.5mg  full pill

## 2011-04-12 ENCOUNTER — Encounter: Payer: Self-pay | Admitting: Internal Medicine

## 2011-04-23 ENCOUNTER — Ambulatory Visit (INDEPENDENT_AMBULATORY_CARE_PROVIDER_SITE_OTHER): Payer: Medicare Other | Admitting: Internal Medicine

## 2011-04-23 ENCOUNTER — Encounter: Payer: Self-pay | Admitting: Internal Medicine

## 2011-04-23 DIAGNOSIS — R197 Diarrhea, unspecified: Secondary | ICD-10-CM

## 2011-04-23 DIAGNOSIS — E119 Type 2 diabetes mellitus without complications: Secondary | ICD-10-CM

## 2011-04-23 DIAGNOSIS — R0609 Other forms of dyspnea: Secondary | ICD-10-CM

## 2011-04-23 DIAGNOSIS — R0600 Dyspnea, unspecified: Secondary | ICD-10-CM | POA: Insufficient documentation

## 2011-04-23 DIAGNOSIS — R0989 Other specified symptoms and signs involving the circulatory and respiratory systems: Secondary | ICD-10-CM

## 2011-04-23 NOTE — Progress Notes (Signed)
Subjective:    Patient ID: Bradley Marshall, male    DOB: February 01, 1929, 78 y.o.   MRN: [id number]  HPI 78 year old male with history of stroke, diabetes, recurrent falls presents for followup. At his last visit he was concerned about daily episodes of diarrhea. We stopped his metformin and he reports that his diarrhea has stopped. He is now taking glipizide. He reports good control of his blood sugars and brings record of his blood sugars today showing most fasting sugars near 100-120. His postprandial sugars are near 140. He denies any low blood sugars. He denies any sugars over 200.  He is concerned today about progressive shortness of breath. This is most noted on exertion. He denies any chest pain, cough, fever, chills. His shortness of breath on exertion has been progressive over the last several months. His daughter notes that he previously took Lasix in the past with some improvement.  Outpatient Encounter Prescriptions as of 04/23/2011  Medication Sig Dispense Refill  . Ascorbic Acid (VITAMIN C) 100 MG tablet Take 100 mg by mouth daily.        Marland Kitchen aspirin 325 MG tablet Take 325 mg by mouth daily.        . B Complex Vitamins (B COMPLEX-B12) TABS Take by mouth daily.        . Cinnamon 500 MG capsule Take 500 mg by mouth daily.        . clopidogrel (PLAVIX) 75 MG tablet Take 1 tablet (75 mg total) by mouth daily.  90 tablet  4  . furosemide (LASIX) 20 MG tablet Take 1 tablet daily x 2 days, then discuss with physician at visit  30 tablet  0  . levothyroxine (LEVOTHROID) 100 MCG tablet Take 1 tablet (100 mcg total) by mouth daily.  90 tablet  1  . mometasone (NASONEX) 50 MCG/ACT nasal spray Place 1 spray into the nose daily.        . Multiple Vitamins-Minerals (MULTIVITAL) tablet Take 1 tablet by mouth daily.        . Omega-3 Fatty Acids (FISH OIL) 1000 MG CAPS Take by mouth daily.        . ramipril (ALTACE) 2.5 MG capsule Take 1 capsule (2.5 mg total) by mouth daily.  90 capsule  4  . simvastatin  (ZOCOR) 10 MG tablet Take 1 tablet (10 mg total) by mouth daily.  90 tablet  4  . vitamin D, CHOLECALCIFEROL, 400 UNITS tablet Take 400 Units by mouth daily.        Marland Kitchen glipiZIDE (GLIPIZIDE XL) 2.5 MG 24 hr tablet Take 1 tablet (2.5 mg total) by mouth daily.  30 tablet  1    Review of Systems  Constitutional: Negative for fever, chills, activity change, appetite change, fatigue and unexpected weight change.  Eyes: Negative for visual disturbance.  Respiratory: Positive for shortness of breath. Negative for cough and chest tightness.   Cardiovascular: Negative for chest pain, palpitations and leg swelling.  Gastrointestinal: Negative for abdominal pain and abdominal distention.  Genitourinary: Negative for dysuria, urgency and difficulty urinating.  Musculoskeletal: Positive for gait problem. Negative for arthralgias.  Skin: Negative for color change and rash.  Neurological: Positive for weakness.  Hematological: Negative for adenopathy.  Psychiatric/Behavioral: Negative for sleep disturbance and dysphoric mood. The patient is not nervous/anxious.    BP 120/78  Pulse 89  Temp(Src) 98.4 F (36.9 C) (Oral)  Resp 14  Wt 169 lb (76.658 kg)  SpO2 97%     Objective:   Physical  Exam  Constitutional: He is oriented to person, place, and time. He appears well-developed and well-nourished. No distress.  HENT:  Head: Normocephalic and atraumatic.  Right Ear: External ear normal.  Left Ear: External ear normal.  Nose: Nose normal.  Mouth/Throat: Oropharynx is clear and moist. No oropharyngeal exudate.  Eyes: Conjunctivae and EOM are normal. Pupils are equal, round, and reactive to light. Right eye exhibits no discharge. Left eye exhibits no discharge. No scleral icterus.  Neck: Normal range of motion. Neck supple. No tracheal deviation present. No thyromegaly present.  Cardiovascular: Normal rate, regular rhythm and normal heart sounds.  Exam reveals no gallop and no friction rub.   No murmur  heard. Pulmonary/Chest: Effort normal. No respiratory distress. He has no wheezes. He has rales (bilateral bases). He exhibits no tenderness.  Musculoskeletal: Normal range of motion. He exhibits no edema.  Lymphadenopathy:    He has no cervical adenopathy.  Neurological: He is alert and oriented to person, place, and time. No cranial nerve deficit. He exhibits abnormal muscle tone. Coordination abnormal.  Skin: Skin is warm and dry. No rash noted. He is not diaphoretic. No erythema. No pallor.  Psychiatric: He has a normal mood and affect. His behavior is normal. Judgment and thought content normal.          Assessment & Plan:

## 2011-04-23 NOTE — Assessment & Plan Note (Signed)
BG well controlled off metformin. Will continue glipizide. Follow up 1 week.

## 2011-04-23 NOTE — Assessment & Plan Note (Signed)
Resolved with stopping metformin. Will continue to monitor.

## 2011-04-23 NOTE — Patient Instructions (Signed)
Start Lasix 20mg  daily x 3 days.  Call/email with update as to breathing and overall status on Monday.  Follow up 1 month.

## 2011-04-23 NOTE — Assessment & Plan Note (Signed)
Exam remarkable for crackles bilateral bases c/w pulm edema, however recent CXR normal. No h/o smoking or smoke exposure and no exam findings suggestive of COPD.  Will plan to restart Lasix daily x 3 days, then re-eval. If no improvement, will get ECHO. Follow up 1 week.

## 2011-04-26 ENCOUNTER — Telehealth: Payer: Self-pay | Admitting: *Deleted

## 2011-04-26 DIAGNOSIS — R0600 Dyspnea, unspecified: Secondary | ICD-10-CM

## 2011-04-26 NOTE — Telephone Encounter (Signed)
OK. Please order 2D ECHO. Can be completed at [facility]

## 2011-04-26 NOTE — Telephone Encounter (Signed)
Daughter left Vm, there has been NO improvement in pt's breathing on the lasix. She would like ECHO to be ordered.

## 2011-04-27 NOTE — Telephone Encounter (Signed)
W or w/o contrast? Sorry I have never ordered this test.

## 2011-04-28 NOTE — Telephone Encounter (Signed)
Daughter informed that echo was in process

## 2011-05-25 ENCOUNTER — Encounter: Payer: Self-pay | Admitting: Internal Medicine

## 2011-05-31 ENCOUNTER — Encounter: Payer: Self-pay | Admitting: Internal Medicine

## 2011-05-31 ENCOUNTER — Other Ambulatory Visit: Payer: Self-pay | Admitting: *Deleted

## 2011-05-31 MED ORDER — GLIPIZIDE ER 2.5 MG PO TB24: 2.5000 mg | ORAL_TABLET | Freq: Every day | ORAL | Status: DC

## 2011-06-13 ENCOUNTER — Emergency Department: Payer: Self-pay | Admitting: Emergency Medicine

## 2011-06-13 LAB — COMPREHENSIVE METABOLIC PANEL
Albumin: 3.6 g/dL (ref 3.4–5.0)
Anion Gap: 8 (ref 7–16)
BUN: 23 mg/dL — ABNORMAL HIGH (ref 7–18)
Calcium, Total: 8.4 mg/dL — ABNORMAL LOW (ref 8.5–10.1)
Chloride: 107 mmol/L (ref 98–107)
Creatinine: 1.1 mg/dL (ref 0.60–1.30)
EGFR (African American): >60
EGFR (Non-African Amer.): >60
Potassium: 3.6 mmol/L (ref 3.5–5.1)
SGOT(AST): 21 U/L (ref 15–37)
SGPT (ALT): 18 U/L
Sodium: 142 mmol/L (ref 136–145)
Total Protein: 6.7 g/dL (ref 6.4–8.2)

## 2011-06-13 LAB — CBC
HCT: 39.2 % — ABNORMAL LOW (ref 40.0–52.0)
Platelet: 170 10*3/uL (ref 150–440)
RBC: 4.56 10*6/uL (ref 4.40–5.90)
RDW: 15.8 % — ABNORMAL HIGH (ref 11.5–14.5)
WBC: 6.1 10*3/uL (ref 3.8–10.6)

## 2011-06-13 LAB — URINALYSIS, COMPLETE
Bacteria: NONE SEEN
Nitrite: NEGATIVE
Protein: NEGATIVE
Specific Gravity: 1.021 (ref 1.003–1.030)

## 2011-06-13 LAB — TROPONIN I: Troponin-I: <0.02 ng/mL

## 2011-06-28 ENCOUNTER — Ambulatory Visit: Payer: Self-pay | Admitting: Internal Medicine

## 2011-06-28 DIAGNOSIS — I517 Cardiomegaly: Secondary | ICD-10-CM

## 2011-07-05 ENCOUNTER — Encounter: Payer: Self-pay | Admitting: Internal Medicine

## 2011-07-05 MED ORDER — ALBUTEROL SULFATE HFA 108 (90 BASE) MCG/ACT IN AERS: 2.0000 | INHALATION_SPRAY | Freq: Four times a day (QID) | RESPIRATORY_TRACT | Status: DC | PRN

## 2011-07-09 ENCOUNTER — Ambulatory Visit: Payer: Medicare Other | Admitting: Cardiovascular Disease

## 2011-08-04 ENCOUNTER — Encounter: Payer: Self-pay | Admitting: Internal Medicine

## 2011-08-05 ENCOUNTER — Observation Stay: Payer: Self-pay | Admitting: Internal Medicine

## 2011-08-05 LAB — URINALYSIS, COMPLETE
Blood: NEGATIVE
Glucose,UR: NEGATIVE mg/dL (ref 0–75)
Ketone: NEGATIVE
Nitrite: NEGATIVE
Ph: 7 (ref 4.5–8.0)
Protein: NEGATIVE
Squamous Epithelial: NONE SEEN
WBC UR: <1 /HPF (ref 0–5)

## 2011-08-05 LAB — CBC
HCT: 41.1 % (ref 40.0–52.0)
HGB: 13.4 g/dL (ref 13.0–18.0)
MCHC: 32.7 g/dL (ref 32.0–36.0)
MCV: 86 fL (ref 80–100)
Platelet: 167 10*3/uL (ref 150–440)
RDW: 16.3 % — ABNORMAL HIGH (ref 11.5–14.5)

## 2011-08-05 LAB — COMPREHENSIVE METABOLIC PANEL
Albumin: 3.5 g/dL (ref 3.4–5.0)
BUN: 20 mg/dL — ABNORMAL HIGH (ref 7–18)
Bilirubin,Total: 0.5 mg/dL (ref 0.2–1.0)
Calcium, Total: 8.9 mg/dL (ref 8.5–10.1)
Co2: 25 mmol/L (ref 21–32)
EGFR (African American): >60
EGFR (Non-African Amer.): >60
Glucose: 148 mg/dL — ABNORMAL HIGH (ref 65–99)
Osmolality: 285 (ref 275–301)
Potassium: 3.8 mmol/L (ref 3.5–5.1)
SGOT(AST): 23 U/L (ref 15–37)
SGPT (ALT): 21 U/L
Total Protein: 7.1 g/dL (ref 6.4–8.2)

## 2011-08-06 ENCOUNTER — Telehealth: Payer: Self-pay | Admitting: Internal Medicine

## 2011-08-06 ENCOUNTER — Encounter: Payer: Self-pay | Admitting: Internal Medicine

## 2011-08-06 LAB — BASIC METABOLIC PANEL
Anion Gap: 11 (ref 7–16)
Calcium, Total: 9 mg/dL (ref 8.5–10.1)
EGFR (Non-African Amer.): >60
Glucose: 136 mg/dL — ABNORMAL HIGH (ref 65–99)
Osmolality: 283 (ref 275–301)

## 2011-08-06 LAB — LIPID PANEL
Cholesterol: 150 mg/dL (ref 0–200)
HDL Cholesterol: 49 mg/dL (ref 40–60)
Ldl Cholesterol, Calc: 76 mg/dL (ref 0–100)
VLDL Cholesterol, Calc: 25 mg/dL (ref 5–40)

## 2011-08-06 LAB — TROPONIN I: Troponin-I: <0.02 ng/mL

## 2011-08-06 MED ORDER — GLIPIZIDE ER 2.5 MG PO TB24: 2.5000 mg | ORAL_TABLET | Freq: Every day | ORAL | Status: DC

## 2011-08-06 NOTE — Telephone Encounter (Signed)
Bradley Marshall called Bradley Marshall is being discharged today from [facility]  They want him  To have pt and ot  Bradley Marshall  need a verbal order for this

## 2011-08-06 NOTE — Telephone Encounter (Signed)
Patient discharged from hospital put in follow up appointment.

## 2011-08-06 NOTE — Telephone Encounter (Signed)
Fine for verbal order for PT

## 2011-08-09 ENCOUNTER — Telehealth: Payer: Self-pay | Admitting: Internal Medicine

## 2011-08-09 NOTE — Telephone Encounter (Signed)
There was already a note about this. Please see other note.

## 2011-08-09 NOTE — Telephone Encounter (Signed)
She called and states that she needs a verbal from Dr. Dan Humphreys stating that she would sign orders for PT and OT.  Please advise.

## 2011-08-09 NOTE — Telephone Encounter (Signed)
That would be fine 

## 2011-08-09 NOTE — Telephone Encounter (Signed)
Verbal order given  

## 2011-08-23 ENCOUNTER — Encounter: Payer: Self-pay | Admitting: Internal Medicine

## 2011-08-23 ENCOUNTER — Ambulatory Visit (INDEPENDENT_AMBULATORY_CARE_PROVIDER_SITE_OTHER): Payer: Medicare Other | Admitting: Internal Medicine

## 2011-08-23 ENCOUNTER — Ambulatory Visit: Payer: Self-pay | Admitting: Internal Medicine

## 2011-08-23 VITALS — BP 150/80 | HR 86 | Temp 98.6°F | Wt 179.5 lb

## 2011-08-23 DIAGNOSIS — R0609 Other forms of dyspnea: Secondary | ICD-10-CM

## 2011-08-23 DIAGNOSIS — Z5189 Encounter for other specified aftercare: Secondary | ICD-10-CM

## 2011-08-23 DIAGNOSIS — R0600 Dyspnea, unspecified: Secondary | ICD-10-CM

## 2011-08-23 DIAGNOSIS — R5383 Other fatigue: Secondary | ICD-10-CM

## 2011-08-23 DIAGNOSIS — R5381 Other malaise: Secondary | ICD-10-CM

## 2011-08-23 DIAGNOSIS — I10 Essential (primary) hypertension: Secondary | ICD-10-CM

## 2011-08-23 DIAGNOSIS — E039 Hypothyroidism, unspecified: Secondary | ICD-10-CM

## 2011-08-23 DIAGNOSIS — R269 Unspecified abnormalities of gait and mobility: Secondary | ICD-10-CM

## 2011-08-23 DIAGNOSIS — R0989 Other specified symptoms and signs involving the circulatory and respiratory systems: Secondary | ICD-10-CM

## 2011-08-23 DIAGNOSIS — R609 Edema, unspecified: Secondary | ICD-10-CM

## 2011-08-23 DIAGNOSIS — Z23 Encounter for immunization: Secondary | ICD-10-CM

## 2011-08-23 DIAGNOSIS — I69998 Other sequelae following unspecified cerebrovascular disease: Secondary | ICD-10-CM

## 2011-08-23 LAB — COMPREHENSIVE METABOLIC PANEL
AST: 17 U/L (ref 0–37)
Albumin: 3.9 g/dL (ref 3.5–5.2)
Alkaline Phosphatase: 74 U/L (ref 39–117)
Potassium: 3.6 mEq/L (ref 3.5–5.1)
Sodium: 139 mEq/L (ref 135–145)
Total Bilirubin: 0.4 mg/dL (ref 0.3–1.2)
Total Protein: 7.2 g/dL (ref 6.0–8.3)

## 2011-08-23 LAB — CBC WITH DIFFERENTIAL/PLATELET
Eosinophils Relative: 5.5 % — ABNORMAL HIGH (ref 0.0–5.0)
HCT: 40.4 % (ref 39.0–52.0)
Hemoglobin: 13.1 g/dL (ref 13.0–17.0)
Lymphs Abs: 1.3 10*3/uL (ref 0.7–4.0)
MCV: 87.2 fl (ref 78.0–100.0)
Monocytes Absolute: 0.4 10*3/uL (ref 0.1–1.0)
Monocytes Relative: 6.8 % (ref 3.0–12.0)
Neutro Abs: 4.1 10*3/uL (ref 1.4–7.7)
Platelets: 186 10*3/uL (ref 150.0–400.0)
RDW: 16.2 % — ABNORMAL HIGH (ref 11.5–14.6)
WBC: 6.2 10*3/uL (ref 4.5–10.5)

## 2011-08-23 LAB — TSH: TSH: 32.26 u[IU]/mL — ABNORMAL HIGH (ref 0.35–5.50)

## 2011-08-23 NOTE — Assessment & Plan Note (Signed)
Given asymmetric edema in the lower extremities, DVT with consideration. However, ultrasound of the right lower extremity is negative for DVT. Likely dependent edema from lack of mobility. Will continue to keep legs elevated and Dr. position is much as possible when at home.

## 2011-08-23 NOTE — Progress Notes (Signed)
Subjective:    Patient ID: Bradley Marshall, male    DOB: [birth year], 78 y.o.   MRN: [id number]  HPI 78 year old male with history of stroke resulting in right-sided weakness presents for followup after recent hospitalization for severe weakness and some confusion. CT brain and MRI brain were negative for acute process during that admission.Symptoms have now resolved. He is currently living at home and has care during the daytime from his family members and hired help. He is able to walk with assistance. He uses a [employer] at home. His daughter's report that his appetite has been good. He notes some swelling in his right greater than left legs. This does not improve with elevation of his legs. He also intermittently has some shortness of breath. This occurs with minimal exertion such as walking across the room. He denies chest pain or palpitations. Echo performed in April 2013 showed mild diastolic dysfunction.  Outpatient Encounter Prescriptions as of 08/23/2011  Medication Sig Dispense Refill  . albuterol (PROVENTIL HFA;VENTOLIN HFA) 108 (90 BASE) MCG/ACT inhaler Inhale 2 puffs into the lungs every 6 (six) hours as needed for wheezing.  1 Inhaler  0  . Ascorbic Acid (VITAMIN C) 100 MG tablet Take 100 mg by mouth daily.        Marland Kitchen aspirin 325 MG tablet Take 325 mg by mouth daily.        . B Complex Vitamins (B COMPLEX-B12) TABS Take by mouth daily.        . Cinnamon 500 MG capsule Take 500 mg by mouth daily.        . clopidogrel (PLAVIX) 75 MG tablet Take 1 tablet (75 mg total) by mouth daily.  90 tablet  4  . furosemide (LASIX) 20 MG tablet Take 1 tablet daily x 2 days, then discuss with physician at visit  30 tablet  0  . glipiZIDE (GLIPIZIDE XL) 2.5 MG 24 hr tablet Take 1 tablet (2.5 mg total) by mouth daily.  30 tablet  3  . levothyroxine (LEVOTHROID) 100 MCG tablet Take 1 tablet (100 mcg total) by mouth daily.  90 tablet  1  . mometasone (NASONEX) 50 MCG/ACT nasal spray Place 1 spray into the nose  daily.        . Multiple Vitamins-Minerals (MULTIVITAL) tablet Take 1 tablet by mouth daily.        . Omega-3 Fatty Acids (FISH OIL) 1000 MG CAPS Take by mouth daily.        . ramipril (ALTACE) 2.5 MG capsule Take 1 capsule (2.5 mg total) by mouth daily.  90 capsule  4  . simvastatin (ZOCOR) 10 MG tablet Take 1 tablet (10 mg total) by mouth daily.  90 tablet  4  . vitamin D, CHOLECALCIFEROL, 400 UNITS tablet Take 400 Units by mouth daily.         BP 150/80  Pulse 86  Temp 98.6 F (37 C) (Oral)  Wt 179 lb 8 oz (81.421 kg)  SpO2 93%  Review of Systems  Constitutional: Negative for fever, chills, activity change, appetite change, fatigue and unexpected weight change.  Eyes: Negative for visual disturbance.  Respiratory: Negative for cough and shortness of breath.   Cardiovascular: Positive for leg swelling. Negative for chest pain and palpitations.  Gastrointestinal: Negative for abdominal pain and abdominal distention.  Genitourinary: Negative for dysuria, urgency and difficulty urinating.  Musculoskeletal: Positive for gait problem. Negative for arthralgias.  Skin: Negative for color change and rash.  Neurological: Positive for weakness.  Hematological: Negative  for adenopathy.  Psychiatric/Behavioral: Positive for confusion. Negative for disturbed wake/sleep cycle and dysphoric mood. The patient is not nervous/anxious.        Objective:   Physical Exam  Constitutional: He is oriented to person, place, and time. He appears well-developed and well-nourished. No distress.  HENT:  Head: Normocephalic and atraumatic.  Right Ear: External ear normal.  Left Ear: External ear normal.  Nose: Nose normal.  Mouth/Throat: Oropharynx is clear and moist. No oropharyngeal exudate.  Eyes: Conjunctivae and EOM are normal. Pupils are equal, round, and reactive to light. Right eye exhibits no discharge. Left eye exhibits no discharge. No scleral icterus.  Neck: Normal range of motion. Neck  supple. No tracheal deviation present. No thyromegaly present.  Cardiovascular: Normal rate, regular rhythm and normal heart sounds.  Exam reveals no gallop and no friction rub.   No murmur heard. Pulmonary/Chest: Effort normal and breath sounds normal. No respiratory distress. He has no wheezes. He has no rales. He exhibits no tenderness.  Musculoskeletal: Normal range of motion. He exhibits edema (right > left leg to mid shin).  Lymphadenopathy:    He has no cervical adenopathy.  Neurological: He is alert and oriented to person, place, and time. No cranial nerve deficit. He exhibits abnormal muscle tone. Coordination abnormal.  Skin: Skin is warm and dry. No rash noted. He is not diaphoretic. No erythema. No pallor.  Psychiatric: He has a normal mood and affect. His behavior is normal. Judgment and thought content normal.          Assessment & Plan:

## 2011-08-23 NOTE — Assessment & Plan Note (Signed)
Exam is normal today. Suspect dyspnea on exertion is related to deconditioning. Echo performed in April 2013 showed mild diastolic dysfunction. He is currently undergoing physical therapy, so will continue to monitor see if any improvement.

## 2011-08-23 NOTE — Progress Notes (Signed)
Call report from [facility]ias): Right leg ultrasound negative for DVT, patient was sent home.

## 2011-08-23 NOTE — Assessment & Plan Note (Signed)
Lab work today shows marked elevation of TSH. Will confirm with the family that patient has been taking his Synthroid. Will plan to increase dose to 125 mcg daily and repeat TSH in one month.

## 2011-08-30 ENCOUNTER — Encounter: Payer: Self-pay | Admitting: Internal Medicine

## 2011-09-01 ENCOUNTER — Encounter: Payer: Self-pay | Admitting: Internal Medicine

## 2011-11-01 ENCOUNTER — Encounter: Payer: Self-pay | Admitting: Internal Medicine

## 2011-11-05 ENCOUNTER — Encounter: Payer: Self-pay | Admitting: Internal Medicine

## 2011-11-09 ENCOUNTER — Other Ambulatory Visit: Payer: Self-pay | Admitting: *Deleted

## 2011-11-09 MED ORDER — LEVOTHYROXINE SODIUM 100 MCG PO TABS: 100.0000 ug | ORAL_TABLET | Freq: Every day | ORAL | Status: DC

## 2011-11-10 ENCOUNTER — Encounter: Payer: Self-pay | Admitting: Internal Medicine

## 2011-11-11 ENCOUNTER — Encounter: Payer: Self-pay | Admitting: Internal Medicine

## 2011-11-11 ENCOUNTER — Telehealth: Payer: Self-pay | Admitting: Internal Medicine

## 2011-11-11 NOTE — Telephone Encounter (Signed)
Form from [employer] in box.   Pt daughter would like you to mail form to address stated on form Please advise when this is done

## 2011-11-12 NOTE — Telephone Encounter (Signed)
Form faxed to [facility] at [phone] and original copy mailed to Claims Dept [address] [place] [address].

## 2011-11-23 ENCOUNTER — Encounter (INDEPENDENT_AMBULATORY_CARE_PROVIDER_SITE_OTHER): Payer: Medicare Other | Admitting: Internal Medicine

## 2011-11-23 ENCOUNTER — Telehealth: Payer: Self-pay | Admitting: Internal Medicine

## 2011-11-23 ENCOUNTER — Encounter: Payer: Self-pay | Admitting: Cardiovascular Disease

## 2011-11-23 DIAGNOSIS — I10 Essential (primary) hypertension: Secondary | ICD-10-CM

## 2011-11-23 DIAGNOSIS — E785 Hyperlipidemia, unspecified: Secondary | ICD-10-CM

## 2011-11-23 MED ORDER — SIMVASTATIN 10 MG PO TABS: 10.0000 mg | ORAL_TABLET | Freq: Every day | ORAL | Status: DC

## 2011-11-23 MED ORDER — RAMIPRIL 2.5 MG PO CAPS: 2.5000 mg | ORAL_CAPSULE | Freq: Every day | ORAL | Status: DC

## 2011-11-23 MED ORDER — CLOPIDOGREL BISULFATE 75 MG PO TABS: 75.0000 mg | ORAL_TABLET | Freq: Every day | ORAL | Status: DC

## 2011-11-23 NOTE — Telephone Encounter (Signed)
Barbara from [employer] is calling on this patient needing more diagnosis codes to support the reason for there services . They are needing the date of his stroke and any other diagnosis.

## 2011-11-23 NOTE — Telephone Encounter (Signed)
Initial stroke 2008

## 2011-11-24 NOTE — Telephone Encounter (Signed)
Britta Mccreedy advised as instructed via telephone.

## 2011-12-02 ENCOUNTER — Other Ambulatory Visit: Payer: Self-pay | Admitting: Internal Medicine

## 2012-04-22 ENCOUNTER — Other Ambulatory Visit: Payer: Self-pay

## 2012-06-26 ENCOUNTER — Encounter: Payer: Self-pay | Admitting: *Deleted

## 2012-06-26 ENCOUNTER — Other Ambulatory Visit: Payer: Self-pay | Admitting: *Deleted

## 2012-06-26 MED ORDER — GLIPIZIDE ER 2.5 MG PO TB24: ORAL_TABLET | ORAL | Status: DC

## 2012-07-06 ENCOUNTER — Telehealth: Payer: Self-pay | Admitting: Internal Medicine

## 2012-07-06 NOTE — Telephone Encounter (Signed)
6/4 is 4 weeks out. We could try a Tuesday, maybe Tuesday May 13th at noon?

## 2012-07-06 NOTE — Telephone Encounter (Signed)
Dr walker see below my chart message.  The next 30 min appointment you have is 08/09/12 @ 8.  Is that ok for him to wait that long? Thanks Zella Ball   Thanks for your quick response. We need to see Dr. Dan Humphreys for this visit. A couple weeks or more will be fine.      Thanks again.      Pam      ----- Message -----   From: Katheren Puller   Sent: 07/06/2012 11:28 AM EDT   To: Soyla Murphy   Subject: RE: Appointment Request      I can get him in tomorrow 07/07/12 @ 10:45 with Raquel Rey. It would be a couple weeks before i can get him in with Dr Dan Humphreys   Is this ok?   Thank you   Robin      ----- Message -----   FromSoyla Murphy   Sent: 07/06/2012 10:30 AM EDT   To: Patient Appointment Schedule Request Mailing List   Subject: Appointment Request      Appointment Request From: Soyla Murphy      With Provider: Wynona Dove, MD [facility] [place] [facility]]      Preferred Date Range: From 07/06/2012 To 08/04/2012      Preferred Times: Monday Morning, Tuesday Morning, Wednesday Morning, Thursday Morning, Friday Morning      Reason for visit: Annual Physical      Comments:   Dad has become incontinent and dementia has become worse. He needs health assessment.

## 2012-07-07 ENCOUNTER — Ambulatory Visit: Payer: Medicare Other | Admitting: Adult Health

## 2012-07-11 NOTE — Telephone Encounter (Signed)
Daughter aware of appointment

## 2012-07-18 ENCOUNTER — Ambulatory Visit (INDEPENDENT_AMBULATORY_CARE_PROVIDER_SITE_OTHER): Payer: Medicare Other | Admitting: Internal Medicine

## 2012-07-18 ENCOUNTER — Encounter: Payer: Self-pay | Admitting: Internal Medicine

## 2012-07-18 VITALS — BP 120/88 | HR 98 | Temp 97.5°F

## 2012-07-18 DIAGNOSIS — F0390 Unspecified dementia without behavioral disturbance: Secondary | ICD-10-CM

## 2012-07-18 DIAGNOSIS — I10 Essential (primary) hypertension: Secondary | ICD-10-CM

## 2012-07-18 DIAGNOSIS — L8992 Pressure ulcer of unspecified site, stage 2: Secondary | ICD-10-CM | POA: Insufficient documentation

## 2012-07-18 DIAGNOSIS — E039 Hypothyroidism, unspecified: Secondary | ICD-10-CM

## 2012-07-18 DIAGNOSIS — L8990 Pressure ulcer of unspecified site, unspecified stage: Secondary | ICD-10-CM

## 2012-07-18 DIAGNOSIS — E119 Type 2 diabetes mellitus without complications: Secondary | ICD-10-CM

## 2012-07-18 LAB — COMPREHENSIVE METABOLIC PANEL
Albumin: 3.5 g/dL (ref 3.5–5.2)
CO2: 25 mEq/L (ref 19–32)
GFR: 61.09 mL/min (ref >60.00)
Glucose, Bld: 296 mg/dL — ABNORMAL HIGH (ref 70–99)
Potassium: 4.3 mEq/L (ref 3.5–5.1)
Sodium: 137 mEq/L (ref 135–145)
Total Protein: 6.9 g/dL (ref 6.0–8.3)

## 2012-07-18 LAB — TSH: TSH: 6.36 u[IU]/mL — ABNORMAL HIGH (ref 0.35–5.50)

## 2012-07-18 MED ORDER — GLIPIZIDE ER 2.5 MG PO TB24: ORAL_TABLET | ORAL | Status: DC

## 2012-07-18 MED ORDER — MOMETASONE FUROATE 50 MCG/ACT NA SUSP: 1.0000 | Freq: Every day | NASAL | Status: AC

## 2012-07-18 NOTE — Assessment & Plan Note (Addendum)
Gradually worsening vascular dementia. Will set up home health care to provide assistance to family. Reviewed medication list and stopped unnecessary medications today. Will check TSH and B12 with labs.

## 2012-07-18 NOTE — Assessment & Plan Note (Signed)
BP Readings from Last 3 Encounters:  07/18/12 120/88  08/23/11 150/80  04/23/11 120/78   BP well controlled on Ramipril. Will check renal function with labs.

## 2012-07-18 NOTE — Progress Notes (Signed)
Subjective:    Patient ID: Bradley Marshall, male    DOB: 11-06-1928, 79 y.o.   MRN: [id number]  HPI 79 year old male with history of stroke, hypothyroidism, diabetes, hypertension presents for followup. He is with both of his daughters. He now requires 24-hour a day care and has an Geophysicist/field seismologist in his home. He is unable to perform any activities of daily living. His daughters note that his dementia has worsened and he typically responds by repeating phrases that they say. They report that he has a good appetite. He is incontinent of bladder. They're concerned about a red area on his skin on his upper back. They're unsure how long this has been present. They're interested in getting additional assistance with his care in the home. He would prefer to stay in the home as long as possible.  Outpatient Prescriptions Prior to Visit  Medication Sig Dispense Refill  . aspirin 325 MG tablet Take 325 mg by mouth daily.        . clopidogrel (PLAVIX) 75 MG tablet Take 1 tablet (75 mg total) by mouth daily.  90 tablet  4  . levothyroxine (LEVOTHROID) 100 MCG tablet Take 1 tablet (100 mcg total) by mouth daily.  90 tablet  3  . ramipril (ALTACE) 2.5 MG capsule Take 1 capsule (2.5 mg total) by mouth daily.  90 capsule  4  . Cinnamon 500 MG capsule Take 500 mg by mouth daily.        Marland Kitchen glipiZIDE (GLUCOTROL XL) 2.5 MG 24 hr tablet TAKE 1 TABLET BY MOUTH DAILY  30 tablet  6  . Multiple Vitamins-Minerals (MULTIVITAL) tablet Take 1 tablet by mouth daily.        . Omega-3 Fatty Acids (FISH OIL) 1000 MG CAPS Take by mouth daily.        . simvastatin (ZOCOR) 10 MG tablet Take 1 tablet (10 mg total) by mouth daily.  90 tablet  4  . albuterol (PROVENTIL HFA;VENTOLIN HFA) 108 (90 BASE) MCG/ACT inhaler Inhale 2 puffs into the lungs every 6 (six) hours as needed for wheezing.  1 Inhaler  0  . vitamin D, CHOLECALCIFEROL, 400 UNITS tablet Take 400 Units by mouth daily.        . Ascorbic Acid (VITAMIN C) 100 MG tablet Take 100 mg by  mouth daily.        . B Complex Vitamins (B COMPLEX-B12) TABS Take by mouth daily.        . furosemide (LASIX) 20 MG tablet Take 1 tablet daily x 2 days, then discuss with physician at visit  30 tablet  0  . mometasone (NASONEX) 50 MCG/ACT nasal spray Place 1 spray into the nose daily.         No facility-administered medications prior to visit.   BP 120/88  Pulse 98  Temp(Src) 97.5 F (36.4 C) (Oral)  SpO2 93%  Review of Systems  Unable to perform ROS      Objective:   Physical Exam  Constitutional: He is oriented to person, place, and time. He appears well-developed and well-nourished. No distress.  HENT:  Head: Normocephalic and atraumatic.  Right Ear: External ear normal.  Left Ear: External ear normal.  Nose: Nose normal.  Mouth/Throat: Oropharynx is clear and moist. No oropharyngeal exudate.  Eyes: Conjunctivae and EOM are normal. Pupils are equal, round, and reactive to light. Right eye exhibits no discharge. Left eye exhibits no discharge. No scleral icterus.  Neck: Normal range of motion. Neck supple. No tracheal deviation  present. No thyromegaly present.  Cardiovascular: Normal rate, regular rhythm and normal heart sounds.  Exam reveals no gallop and no friction rub.   No murmur heard. Pulmonary/Chest: Effort normal and breath sounds normal. No respiratory distress. He has no wheezes. He has no rales. He exhibits no tenderness.  Musculoskeletal: Normal range of motion. He exhibits no edema.  Lymphadenopathy:    He has no cervical adenopathy.  Neurological: He is alert and oriented to person, place, and time. No cranial nerve deficit. Coordination normal.  Skin: Skin is warm and dry. No rash noted. He is not diaphoretic. There is erythema. No pallor.     Psychiatric: He has a normal mood and affect. His behavior is normal. His speech is slurred. Cognition and memory are impaired. He exhibits abnormal recent memory and abnormal remote memory.          Assessment  & Plan:

## 2012-07-18 NOTE — Assessment & Plan Note (Signed)
Will check TSH with labs. 

## 2012-07-18 NOTE — Assessment & Plan Note (Signed)
Will check A1c with labs. Discussed signs/symptoms of hypoglycemia with family and cautioned on monitoring given use of Glipizide.

## 2012-07-24 ENCOUNTER — Encounter: Payer: Self-pay | Admitting: Internal Medicine

## 2012-07-24 ENCOUNTER — Encounter: Payer: Self-pay | Admitting: *Deleted

## 2012-07-25 MED ORDER — TRAZODONE HCL 50 MG PO TABS: 50.0000 mg | ORAL_TABLET | Freq: Every evening | ORAL | Status: DC | PRN

## 2012-07-26 ENCOUNTER — Telehealth: Payer: Self-pay | Admitting: Internal Medicine

## 2012-07-26 NOTE — Telephone Encounter (Signed)
Daughter dropped off [employer] alternative plan of care  Form    In box

## 2012-07-26 NOTE — Telephone Encounter (Signed)
Forms are on the counter

## 2012-07-26 NOTE — Telephone Encounter (Signed)
Form has been completed and left message informing Pam it was ready to be picked up

## 2012-08-02 DIAGNOSIS — I69920 Aphasia following unspecified cerebrovascular disease: Secondary | ICD-10-CM

## 2012-08-02 DIAGNOSIS — I69959 Hemiplegia and hemiparesis following unspecified cerebrovascular disease affecting unspecified side: Secondary | ICD-10-CM

## 2012-08-02 DIAGNOSIS — F0150 Vascular dementia without behavioral disturbance: Secondary | ICD-10-CM

## 2012-08-02 DIAGNOSIS — E119 Type 2 diabetes mellitus without complications: Secondary | ICD-10-CM

## 2012-08-09 ENCOUNTER — Ambulatory Visit: Payer: Medicare Other | Admitting: Internal Medicine

## 2012-08-21 ENCOUNTER — Encounter: Payer: Self-pay | Admitting: Internal Medicine

## 2012-08-23 ENCOUNTER — Telehealth: Payer: Self-pay | Admitting: *Deleted

## 2012-08-23 MED ORDER — GLIPIZIDE ER 2.5 MG PO TB24: 2.5000 mg | ORAL_TABLET | Freq: Two times a day (BID) | ORAL | Status: DC

## 2012-08-23 NOTE — Telephone Encounter (Signed)
Courtney, nurse with [facility] left a message stating she is a little concerned with Bradley Marshall constant high blood sugars in the morning. The family tells her is compliant with taking his medication. Everytime she goes out there, his fasating BS has been high like in the 200s. She thinks maybe he needs to up his glipizide. He is already taking the Glipizide 2.5 mg BID.

## 2012-08-23 NOTE — Telephone Encounter (Signed)
I would prefer not to increase Glipizide across the board as pt cannot tell us if he has symptoms of low blood sugar. We could consider adding sliding scale insulin, however family at last visit really seemed to want to focus on quality of life and not add medications, based on discussion last visit. We could have the nurse given another 2.5mg  Glipizide on days that FASTING BG is >200.

## 2012-08-23 NOTE — Telephone Encounter (Signed)
She said that is good, she will communicate that with the family. Need new Rx for Glipizide sent to pharmacy since he has been taking it twice a day

## 2012-08-23 NOTE — Telephone Encounter (Signed)
Fine to call in Glipizide 2.5mg  po bid prn BG>200 #60 with 3 refill.

## 2012-09-13 ENCOUNTER — Encounter: Payer: Self-pay | Admitting: Internal Medicine

## 2012-09-14 ENCOUNTER — Other Ambulatory Visit: Payer: Self-pay | Admitting: *Deleted

## 2012-09-14 DIAGNOSIS — E119 Type 2 diabetes mellitus without complications: Secondary | ICD-10-CM

## 2012-09-14 MED ORDER — GLUCOSE BLOOD VI STRP: ORAL_STRIP | Status: DC

## 2012-09-14 NOTE — Telephone Encounter (Signed)
Test strips sent to the pharmacy.

## 2012-10-11 ENCOUNTER — Encounter: Payer: Self-pay | Admitting: Internal Medicine

## 2012-10-11 DIAGNOSIS — E119 Type 2 diabetes mellitus without complications: Secondary | ICD-10-CM

## 2012-10-11 MED ORDER — GLUCOSE BLOOD VI STRP: ORAL_STRIP | Status: AC

## 2012-10-30 ENCOUNTER — Other Ambulatory Visit: Payer: Self-pay | Admitting: Internal Medicine

## 2012-12-11 ENCOUNTER — Other Ambulatory Visit: Payer: Self-pay | Admitting: Internal Medicine

## 2012-12-14 MED ORDER — ALBUTEROL SULFATE HFA 108 (90 BASE) MCG/ACT IN AERS: 2.0000 | INHALATION_SPRAY | Freq: Four times a day (QID) | RESPIRATORY_TRACT | Status: AC | PRN

## 2012-12-19 ENCOUNTER — Other Ambulatory Visit: Payer: Self-pay | Admitting: Internal Medicine

## 2013-01-04 ENCOUNTER — Other Ambulatory Visit: Payer: Self-pay | Admitting: Internal Medicine

## 2013-01-11 ENCOUNTER — Other Ambulatory Visit: Payer: Self-pay

## 2013-01-28 ENCOUNTER — Other Ambulatory Visit: Payer: Self-pay | Admitting: Internal Medicine

## 2013-02-02 ENCOUNTER — Other Ambulatory Visit: Payer: Self-pay | Admitting: Internal Medicine

## 2013-02-02 NOTE — Telephone Encounter (Signed)
See if has enough until Dr Dan Humphreys returns - since states needs office visit.  Let me know if not enough or any problems.

## 2013-02-02 NOTE — Telephone Encounter (Signed)
Left message to return call 

## 2013-02-15 ENCOUNTER — Encounter: Payer: Self-pay | Admitting: Internal Medicine

## 2013-02-15 DIAGNOSIS — I639 Cerebral infarction, unspecified: Secondary | ICD-10-CM

## 2013-02-16 ENCOUNTER — Inpatient Hospital Stay: Payer: Self-pay | Admitting: Internal Medicine

## 2013-02-16 LAB — COMPREHENSIVE METABOLIC PANEL
Alkaline Phosphatase: 132 U/L — ABNORMAL HIGH
Anion Gap: 7 (ref 7–16)
BUN: 22 mg/dL — ABNORMAL HIGH (ref 7–18)
Calcium, Total: 8.9 mg/dL (ref 8.5–10.1)
Co2: 24 mmol/L (ref 21–32)
Creatinine: 1.02 mg/dL (ref 0.60–1.30)
EGFR (African American): >60
EGFR (Non-African Amer.): >60
Glucose: 217 mg/dL — ABNORMAL HIGH (ref 65–99)
Osmolality: 282 (ref 275–301)
Potassium: 3.7 mmol/L (ref 3.5–5.1)
SGOT(AST): 17 U/L (ref 15–37)
SGPT (ALT): 13 U/L (ref 12–78)
Sodium: 136 mmol/L (ref 136–145)

## 2013-02-16 LAB — PROTIME-INR
INR: 1.1
Prothrombin Time: 14.5 secs (ref 11.5–14.7)

## 2013-02-16 LAB — CBC
HCT: 33.2 % — ABNORMAL LOW (ref 40.0–52.0)
HGB: 10.2 g/dL — ABNORMAL LOW (ref 13.0–18.0)
MCH: 21.3 pg — ABNORMAL LOW (ref 26.0–34.0)
MCHC: 30.8 g/dL — ABNORMAL LOW (ref 32.0–36.0)
MCV: 69 fL — ABNORMAL LOW (ref 80–100)
RBC: 4.79 10*6/uL (ref 4.40–5.90)
WBC: 8.9 10*3/uL (ref 3.8–10.6)

## 2013-02-16 LAB — URINALYSIS, COMPLETE
Ph: 5 (ref 4.5–8.0)
Protein: 30
RBC,UR: 8 /HPF (ref 0–5)
Specific Gravity: 1.027 (ref 1.003–1.030)
Squamous Epithelial: 1

## 2013-02-17 LAB — CBC WITH DIFFERENTIAL/PLATELET
Basophil #: 0 10*3/uL (ref 0.0–0.1)
Eosinophil #: 0 10*3/uL (ref 0.0–0.7)
Eosinophil %: 0 %
HGB: 9.8 g/dL — ABNORMAL LOW (ref 13.0–18.0)
Lymphocyte %: 9.6 %
MCH: 21.4 pg — ABNORMAL LOW (ref 26.0–34.0)
MCHC: 30.9 g/dL — ABNORMAL LOW (ref 32.0–36.0)
Monocyte #: 0.4 x10 3/mm (ref 0.2–1.0)
Monocyte %: 4.5 %
Neutrophil #: 7.8 10*3/uL — ABNORMAL HIGH (ref 1.4–6.5)
Neutrophil %: 85.6 %
RBC: 4.59 10*6/uL (ref 4.40–5.90)
RDW: 18.3 % — ABNORMAL HIGH (ref 11.5–14.5)
WBC: 9.1 10*3/uL (ref 3.8–10.6)

## 2013-02-17 LAB — BASIC METABOLIC PANEL
Anion Gap: 6 — ABNORMAL LOW (ref 7–16)
Chloride: 105 mmol/L (ref 98–107)
Creatinine: 1.02 mg/dL (ref 0.60–1.30)
EGFR (African American): >60
EGFR (Non-African Amer.): >60
Potassium: 3.7 mmol/L (ref 3.5–5.1)
Sodium: 136 mmol/L (ref 136–145)

## 2013-02-18 ENCOUNTER — Encounter: Payer: Self-pay | Admitting: Internal Medicine

## 2013-02-22 ENCOUNTER — Telehealth: Payer: Self-pay | Admitting: Emergency Medicine

## 2013-02-22 NOTE — Telephone Encounter (Signed)
FYI fwd to Dr. Dan Humphreys

## 2013-02-22 NOTE — Telephone Encounter (Signed)
Bradley Marshall from [facility] to make [place] aware that the patient had been admitted under Hospice. Just an [place]

## 2013-03-08 DEATH — deceased

## 2013-04-27 ENCOUNTER — Other Ambulatory Visit: Payer: Self-pay | Admitting: Internal Medicine

## 2013-04-27 ENCOUNTER — Telehealth: Payer: Self-pay | Admitting: *Deleted

## 2013-04-27 NOTE — Telephone Encounter (Signed)
Ok refill, last TSH elevated, I think pt under hospice care now

## 2013-04-27 NOTE — Telephone Encounter (Signed)
Bradley Marshall, could you forward this patient information to appropriate place. He died at [facility] on 02/25/2013. Information scanned into chart, letter from Hospice.

## 2013-04-27 NOTE — Telephone Encounter (Signed)
I have forwarded the message.

## 2013-04-27 NOTE — Telephone Encounter (Signed)
He did pass away on 2013-03-18. Note under media from Hospice. I sent a note to [facility] to make changes.

## 2013-04-27 NOTE — Telephone Encounter (Signed)
Bradley Marshall, I thought [name] passed away.

## 2014-06-29 NOTE — H&P (Signed)
PATIENT NAME:  Bradley Marshall, Bradley Marshall MR#:  [id number] DATE OF BIRTH:  August 14, 1928  DATE OF ADMISSION:  02/16/2013  PRIMARY CARE PHYSICIAN: Dr. Ronette Deter.   REFERRING PHYSICIAN: Dr. Harvest Dark.   CHIEF COMPLAINT: Altered mental status.   HISTORY OF PRESENT ILLNESS: The patient is an 81 year old white male with a history of multiple strokes in the past, was noted to have change in mental status since yesterday, which has been gradually getting worse. Concerning this, the patient is brought to the Emergency Department. Work-up in the Emergency Department: CT head without contrast showed significant cerebella infarct, acutely evolving large left cerebellar infarct with associated mass effect, with 6 mm of rightward midline shift at the level of the cerebellum. Concerning this, we discussed with the family, who confirmed that the patient is do not resuscitate and intubate. We discussed with the family regarding the goals of care, would want to proceed with palliative care consult.  PAST MEDICAL HISTORY: 1.  Diabetes mellitus.  2.  Previous history of strokes.  3.  Head trauma.  4.  Thyroid cancer.  5.  Cholecystectomy.  6.  Facial reconstruction with plates.  7.  Appendectomy.    ALLERGIES: PENICILLIN AND SULFA.   HOME MEDICATIONS: 1.  Vitamin D3 1 capsule once a day. 2.  Vitamin C 1 capsule once a day.  3.  Vitamin B12 once a day.  4.  Trazodone 50 mg once a day.  5.  Sal palmetto 1 tablet once a day.  6.  Ramipril 2.5 mg once a day.  7.  Plavix 75 mg daily.  8.  Levothyroxine 100 mcg once a day.  9.   Glipizide 2.5 mg a day.  10.  Calcium carbonate 1 tablet once a day.  11.  Aspirin 81 mg daily.    SOCIAL HISTORY: No history of smoking, drinking alcohol or using illicit drugs. Currently lives by himself, with the help of a caregiver around the clock.   FAMILY HISTORY: Father died at the age of 11 with MI. Mother died of cervical cancer. Siblings with diabetes mellitus.    REVIEW OF SYSTEMS: Could not be obtained from the patient as the patient is currently in altered mental status.   PHYSICAL EXAMINATION: GENERAL: This is a thin-built male lying down in the bed, not in distress.  VITAL SIGNS: Temperature 98, pulse 70, blood pressure 140/60, respiratory rate of 19, oxygen saturation is 93% on 2 liters of oxygen.  HEENT: Head normocephalic, atraumatic. There is no sclerae icterus. Conjunctivae normal. Pupils equal and react to light. Could not examine the pupils. NECK: Supple. No lymphadenopathy. No JVD. No carotid bruit. No thyromegaly.  CHEST: Has no focal tenderness.  LUNGS: Bilaterally clear to auscultation.  HEART: S1 and S2 regular. No murmurs are heard.  ABDOMEN: Bowel sounds present. Soft, nontender, nondistended.  EXTREMITIES: No pedal edema. Pulses 2+.  NEUROLOGIC: The patient is not oriented to place, person and time. No apparent cranial nerve abnormalities. Could not examine the motor and sensory.   ASSESSMENT AND PLAN: The patient is an 81 year old male who comes to the Emergency Department after having a stroke.  1.  Cerebrovascular accident. Cerebellar stroke involving the fourth ventricular compression with midline shift. The prognosis is quite critical. I discussed with the family regarding this measurement. Will involve palliative care in the morning. We will keep the patient n.p.o.  2.  Diabetes mellitus. Continue sliding scale insulin until the goals of case.  3.  Hypertension, currently well controlled. Continue  with the ramipril.  4.  Diabetes mellitus. Continue the glipizide.  4.  Keep the patient on deep vein thrombosis prophylaxis with Lovenox.   TIME SPENT: 50 minutes.     ____________________________ Monica Becton, MD pv:cg D: 02/17/2013 02:11:07 ET T: 02/17/2013 03:15:56 ET JOB#: [id number]  cc: Monica Becton, MD, <Dictator> Eduard Clos. Gilford Rile, MD Grier Mitts Watson Robarge MD ELECTRONICALLY SIGNED 03/16/2013 21:02

## 2014-06-30 NOTE — H&P (Signed)
PATIENT NAME:  Bradley Marshall, Bradley Marshall MR#:  [id number] DATE OF BIRTH:  1928-04-03  DATE OF ADMISSION:  08/05/2011  PRIMARY CARE PHYSICIAN: Ronette Deter, MD  CHIEF COMPLAINT: "He fell twice, his legs gave out." History was obtained from the patient and the family member, the daughter. The patient is a very poor historian.   HISTORY OF PRESENT ILLNESS: This is an 81 year old male who has history of prior traumatic brain injury, CVA with right-sided weakness and speech problems, history of thyroid cancer status post thyroidectomy, diabetes, hypertension, and hyperlipidemia. Today the patient was brought into the Emergency Room because he fell twice at home. He lives at home by himself. He has a caretaker in the day hours and at night he is by himself. So this morning he was sitting at the edge of the bed and then he slid down and landed on his hip. His daughter came and walked him to the bathroom, but he completely collapsed. His legs gave away. He has been falling frequently at home. He uses a walker. Even with a walker he needs some assistance to walk. He denies any left hip pain at this time. The daughter is saying that he is weaker on his left lower extremity. He always has chronic weakness on the right side. But he denies any weakness of his left upper arm, any headache, any dizziness, or any change in vision. Also they are saying that he has some choking spells sometimes with food or just when he is sitting. So his initial work up in the Emergency Room has been negative with his urinalysis being negative and CT of the head being negative for any acute abnormality. He is being admitted for weakness and frequent falls, less likely a new cerebrovascular accident. He is complaining of some left-sided upper abdominal chest pain. He is not able to define it. He denies any shortness of breath, any nausea or vomiting.  REVIEW OF SYSTEMS: Review of systems is limited as the patient is a very poor historian. He is  complaining of weakness but no fever, no acute change in vision. He has decreased vision on the right side. He has lid lag there also. He denies any headache or dizziness. No cough. No dyspnea. No chest pain. No nausea, vomiting, diarrhea, hematemesis, or melena. He says he has some left-sided belly pain off and on. He denies any dysuria or frequency. He has thyroid problems. No rash. No history of anemia. No bleeding from any site. No joint pain. He has right-sided weakness. No anxiety.     PAST MEDICAL HISTORY:  1. History of CVA with right-sided weakness and speech disturbances and traumatic brain injury, he was admitted at [facility]. This weakness has been going on for about 2 to 3 years now.  2. History of thyroid cancer status post thyroidectomy, now on thyroid replacement. 3. Diabetes. 4. Hypertension. 5. Hyperlipidemia.   PAST SURGICAL HISTORY:  1. Thyroidectomy. 2. Cholecystectomy. 3. Appendectomy. 4. Facial surgery.  ALLERGIES TO MEDICATIONS: Penicillin and sulfa drugs.   HOME MEDICATIONS:  1. Aspirin 81 mg daily.  2. Calcium carbonate once daily. 3. Cinnamon once daily. 4. Fish oil 1000 mg once daily. 5. Glipizide XL 2.5 mg daily. 6. Levothyroxine 50 mcg daily.  7. Multivitamin daily.  8. Plavix 75 mg daily. 9. Ramipril 2.5 mg daily. 10. Simvastatin 10 mg daily.  11. Saw Palmetto daily.  12. Vitamin B complex once daily. 13. B12, C, and D3.    SOCIAL HISTORY: He lives by himself.  He has a caretaker in the day hours, but he lives by himself at night. No smoking or alcohol use.   FAMILY HISTORY: His father died at 62 of myocardial infarction. His mother died of cervical cancer. Sibling with diabetes.    PHYSICAL EXAMINATION:   VITAL SIGNS: When he presented to the Emergency Room, his temperature was 97.6, heart rate 79, respiratory rate 18, blood pressure 142/76, and saturating 96% on room air.   GENERAL:  He is an elderly Caucasian male. He is well built,  comfortably lying in bed, no acute distress.   HEENT: Bilateral pupils are equal, but sluggishly reactive. Extraocular muscles intact. No scleral icterus. No conjunctivitis. Oral mucosa is moist. No pallor.   NECK: No thyroid tenderness, enlargement, or nodules. Neck is supple. No masses and nontender. No adenopathy. No JVD. No carotid bruit.   LUNGS: Bilateral breath sounds are clear. No wheeze. Normal effort. No respiratory distress.  HEART: Heart sounds are regular. No murmur. Good peripheral pulses. No lower extremity edema.   ABDOMEN: Soft and nontender. Normal bowel sounds. No hepatosplenomegaly. No bruit. No masses.   RECTAL: Examination is deferred.   NEUROLOGIC: He is awake. He is alert. He has chronic right facial palsy. He has right-sided weakness which is chronic. He has contracture of the right upper extremity. He can move his right lower extremity against gravity. Power is greater than 4/5 in left lower extremity and left upper extremity. I did not notice any deficits on the left side. He has dysarthria. He has some residual aphasia.  EXTREMITIES: No cyanosis. No clubbing.   SKIN: No rash. No lesions. No tenderness about the left hip area.   LABS/STUDIES: White count 6.1, hemoglobin 13.4, and platelet count 167,000. BMP: Sodium 140, potassium 3.8, BUN 20, and creatinine 1.14. LFTs are normal. Troponin is negative.   Urinalysis is essentially negative, ketones negative, nitrite and leukocyte esterase negative.   CT of the head showed no acute intracranial process.   Chest x-ray was negative.   Echo done in April 2013 showed it was a difficult study but ejection fraction was grossly normal, some impaired LV relaxation, mild left ventricular hypertrophy.   EKG shows sinus rhythm but no acute ischemic changes, nonspecific T wave changes, some Q waves in the inferior leads.   IMPRESSION:  1. Weakness, rule out new cerebrovascular accident. 2. Frequent falls.  3. History of  CVA with right-sided weakness. 4. Diabetes. 5. Hypertension. 6. Hyperlipidemia. 7. Status post thyroidectomy, on thyroid replacement.   PLAN: An 81 year old male who has multiple medical problems of diabetes, hypertension, hyperlipidemia, and status post thyroidectomy for thyroid cancer, on thyroid replacement. He had previous traumatic brain injury and he had a CVA with right-sided weakness. He has been falling frequently at home. He was brought in because he fell twice at home today. As per the daughter, his legs just collapsed. His power on the left side was greater than 4/5, in both left lower and upper extremities. I did not notice any deficits in the left side. He is already on aspirin and Plavix, which I am going to continue. We will check a MRI of the brain to rule out any new stroke, which is less likely. He most likely has been falling frequently because of his deconditioning because of his previous prior stroke and he needs physical therapy evaluation. I will not order any echocardiogram or ultrasound of the carotids because if the MRI is positive then it can be ordered. He is also  complaining of some nonspecific left-sided abdominal pain and chest pain. We will monitor on telemetry and do serial cardiac enzymes, but it looks like quite  atypical. His EKG does not show any acute ischemia. I discussed the plan with the daughter that he most likely needs intensive physical therapy if stroke is ruled out.   TIME SPENT ON ADMISSION AND COORDINATION OF CARE: 55 minutes. ____________________________ Mena Pauls, MD ag:slb D: 08/05/2011 16:08:46 ET T: 08/05/2011 16:31:09 ET JOB#: [id number]  cc: Mena Pauls, MD, <Dictator> Eduard Clos. Gilford Rile, MD Mena Pauls MD ELECTRONICALLY SIGNED 08/23/2011 11:45

## 2014-06-30 NOTE — H&P (Signed)
PATIENT NAME:  Bradley Marshall, Bradley Marshall MR#:  [id number] DATE OF BIRTH:  08-11-1928  DATE OF ADMISSION:  08/05/2011  ADDENDUM: The family is saying that he chokes at times, not all the time. Considering that he had  a previous stroke and traumatic brain injury, I will get a Speech evaluation on him.   ____________________________ Mena Pauls, MD ag:cbb D: 08/05/2011 16:15:09 ET T: 08/05/2011 16:33:32 ET JOB#: [id number]  cc: Mena Pauls, MD, <Dictator> Mena Pauls MD ELECTRONICALLY SIGNED 08/23/2011 11:44

## 2014-06-30 NOTE — Discharge Summary (Signed)
PATIENT NAME:  Bradley Marshall, Bradley Marshall MR#:  [id number] DATE OF BIRTH:  October 03, 1928  DATE OF ADMISSION:  08/05/2011 DATE OF DISCHARGE:  08/06/2011  ADMISSION DIAGNOSIS: Weakness.   DISCHARGE DIAGNOSES: 1. Weakness with falls.  2. History of cerebrovascular accident.  3. Diabetes.  4. History of hypertension.   CONSULTS: None.   LABORATORY, DIAGNOSTIC AND RADIOLOGICAL DATA:  MRI of the brain showed no acute ischemia.   Sodium 140, potassium 3.4, chloride 105, bicarbonate 24, BUN 17, creatinine 1.12, glucose 136, cholesterol 150, triglycerides 127, HDL 49, VLDL 25, LDL 76. Troponin x3 were negative.   CT of the head showed no acute intracranial hemorrhage or cerebrovascular accident.   HOSPITAL COURSE: 81 year old male with chronic falls who came in with weakness. For further details, please refer to the history and physical.  1. Weakness. Suspect this is related to deconditioning, history of cerebrovascular accident. MRI was negative for acute cerebrovascular accident. Physical therapy recommended home health versus rehab. He did not meet inpatient criteria so he will be discharged home with physical therapy.   2. Diabetes, there are no issues. Patient will resume his outpatient medications.  3. Hypertension. His blood pressure was well controlled while in the hospital.   DISCHARGE MEDICATIONS:  1. Aspirin 81 mg daily.  2. Cinnamon 1 tablet daily.  3. Synthroid 50 mcg daily.  4. Vitamin D3 1 tablet daily.  5. Plavix 75 mg daily.  6. Glipizide 2.5 mg daily.  7. Saw palmetto 1 tablet daily.  8. Vitamin B complex 100 mg daily.  9. Vitamin C 1 tablet daily.  10. Calcium carbonate 1 tablet daily.  11. Vitamin B12 1 tablet daily.  12. Simvastatin 10 mg daily.  13. Ramipril 2.5 mg daily.  14. Multivitamin 1 tablet daily.  15. Fish oil 1000 mg daily.   DISCHARGE DIET: Low sodium, ADA diet.   DISCHARGE ACTIVITY: As tolerated.   DISCHARGE REFERRAL: Home health.   DISCHARGE FOLLOW UP:  08/23/2011 with Dr. Ronette Deter.   TIME SPENT: 35 minutes.   ____________________________ Donell Beers. Benjie Karvonen, MD spm:cms D: 08/06/2011 13:47:49 ET T: 08/06/2011 15:18:03 ET JOB#: [id number]  cc: Nelvin Tomb P. Benjie Karvonen, MD, <Dictator> Eduard Clos. Gilford Rile, MD Donell Beers Oree Hislop MD ELECTRONICALLY SIGNED 08/13/2011 12:41

## 2014-07-07 NOTE — Discharge Summary (Signed)
PATIENT NAME:  Bradley Marshall, Bradley Marshall MR#:  [id number] DATE OF BIRTH:  [birth year]  DATE OF ADMISSION:  02/16/2013 DATE OF DISCHARGE:  02/19/2013  DISCHARGE DIAGNOSIS:  Acute left cerebellar stroke with midline shift.   SECONDARY DIAGNOSES: 1.  Diabetes mellitus.  2.  History of strokes.  3.  Thyroid cancer.   CONSULTATIONS:  Speech therapy.   PROCEDURES AND RADIOLOGY:  CT scan of the head without contrast on 12th of December showed acute evolving large left cerebellar infarct with associated mass effect and likely 6 mL rightward midline shift at the level of cerebellum.  Slight partial effacement of the fourth ventricle.  No definite evidence of hemorrhagic transformation.  Moderate cortical volume loss and scattered small vessel ischemic microangiopathy.  Small chronic lacunar infarct within the right cerebral hemisphere.  Small focus of encephalomalacia at a high left parietal lobe reflecting remote infarct.   MAJOR LABORATORY PANEL:  UA on admission was negative.   HISTORY AND SHORT HOSPITAL COURSE:  The patient is an 81 year old male with above-mentioned medical problems who was admitted for altered mental status, was found to have acute CVA in the cerebellum on the left with midline shift on the right at the level of cerebellum.  Please see Dr. Ward Givens dictated history and physical for further details.  Due to the patient's overall age and significant decline with lethargy, family discussion was held and patient was made comfort care only and was transferred to hospice home as a bed was available on 15th of December.  On the date of discharge, his vital signs were as follows:  Temperature 98.4, heart rate 77, pulmonary respirations 18 per minute, blood pressure 149/77 mmHg.  He was saturating 93% on room air.   PERTINENT PHYSICAL EXAMINATION ON THE DATE OF DISCHARGE:  CARDIOVASCULAR:  S1, S2 normal.  No murmurs, rubs or gallop.  LUNGS:  Clear to auscultation bilaterally.  No wheezing, rales,  rhonchi or crepitation.  ABDOMEN:  Soft, benign.  NEUROLOGIC:  The patient remains unresponsive and critically ill-appearing.   All other physical examination remained at baseline and very critical on the date of discharge.   DISCHARGE MEDICATIONS: 1.  Lorazepam 0.5 mg 1 to 2 tablets by mouth/sublingual every 2 to 4 hours as needed.  2.  Morphine 20 mg per mL 5 mL by mouth every 1 to 2 hours as needed.   DISCHARGE DIET:  As tolerated.   DISCHARGE ACTIVITY:  As tolerated.   DISCHARGE INSTRUCTIONS AND FOLLOW-UP:  The patient was instructed to use medication crush or use liquid when appropriate.  May change to rectal route if unable to swallow.  CODE STATUS:  DO NOT RESUSCITATE.  Foley catheter to be left indwelling for prevention of skin breakdown/urinary incontinence.  2 liters oxygen via nasal cannula to be provided continuous and as needed.   Total time discharging this patient was 55 minutes.    ____________________________ Lucina Mellow. Manuella Ghazi, MD vss:ea D: March 22, 2013 15:16:33 ET T: 03-22-2013 23:20:15 ET JOB#: [id number]  cc: Navi Ewton S. Manuella Ghazi, MD, <Dictator> Eduard Clos. Gilford Rile, MD Lucina Mellow [facility] MD ELECTRONICALLY SIGNED 02/26/2013 7:48

## 2015-06-18 IMAGING — CT CT HEAD WITHOUT CONTRAST
1 of 2 series · 14 of 30 positions shown, 18 images · non-contrast
Comparison: CT of the head performed 06/13/2011

CLINICAL DATA: Altered mental status.

EXAM:
CT HEAD WITHOUT CONTRAST
TECHNIQUE: Contiguous axial images were obtained from the base of the skull
through the vertex without intravenous contrast.

[Series 2: head wo · axial · 0.46mm/px · z∈[+525,+669]mm · 14 of 36 slices shown, 18 images]
[im 2/36  brain]
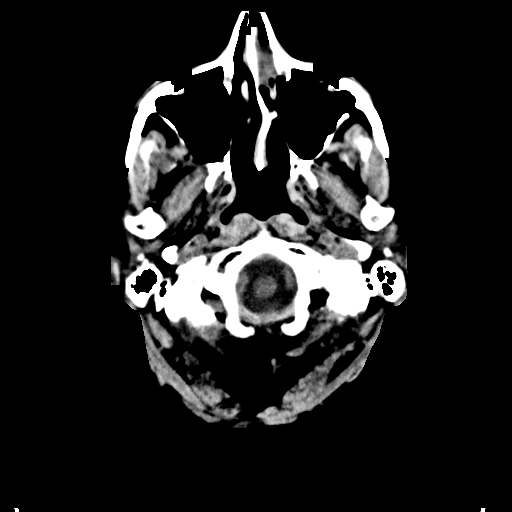
[im 2/36  bone]
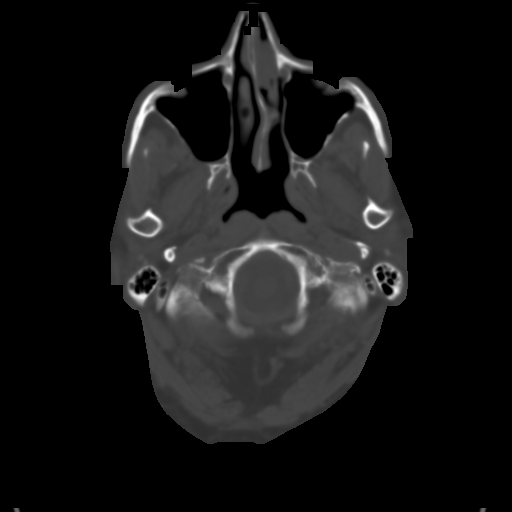
[im 5/36  brain]
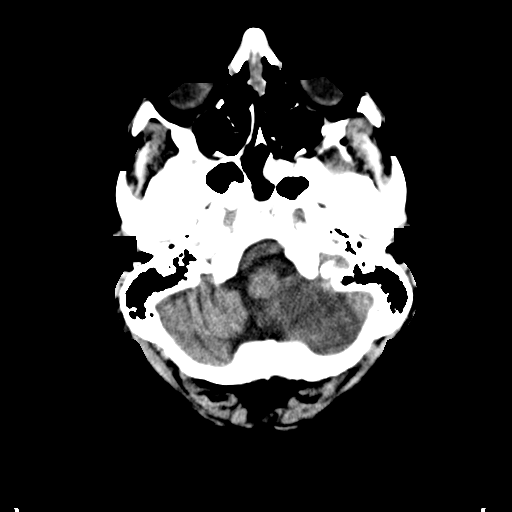
[im 8/36  brain]
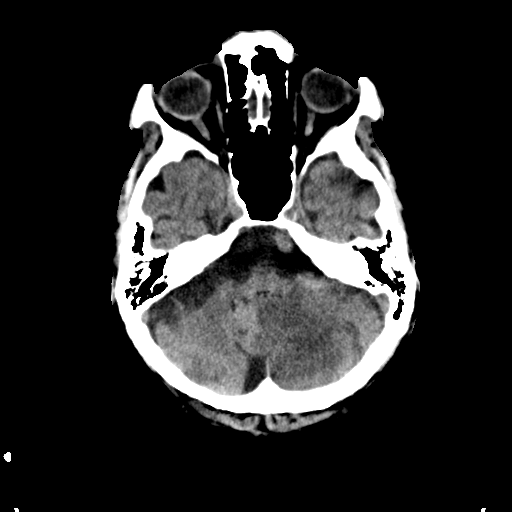
[im 10/36  brain]
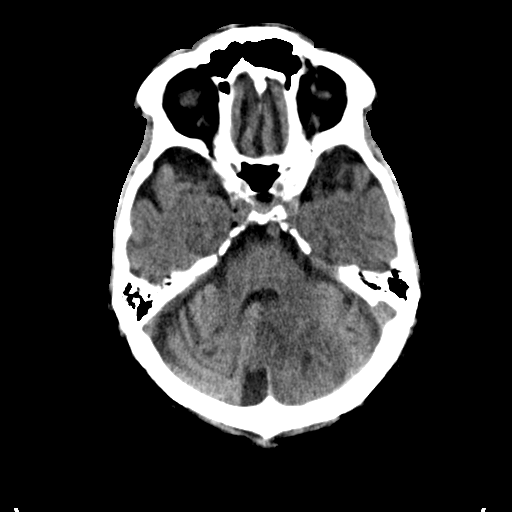
[im 12/36  brain]
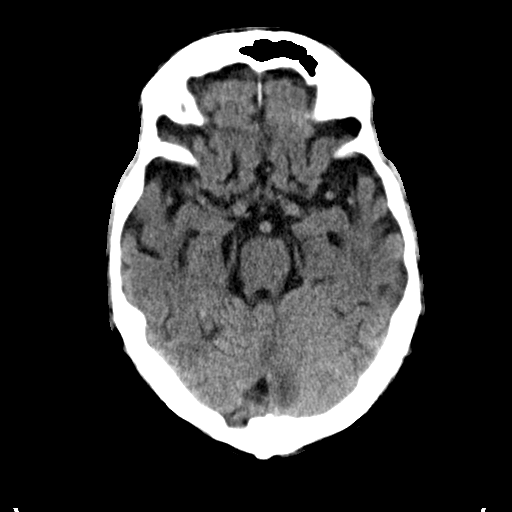
[im 12/36  bone]
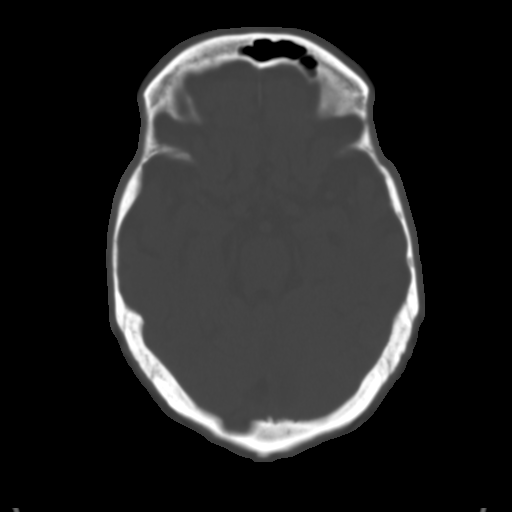
[im 15/36  brain]
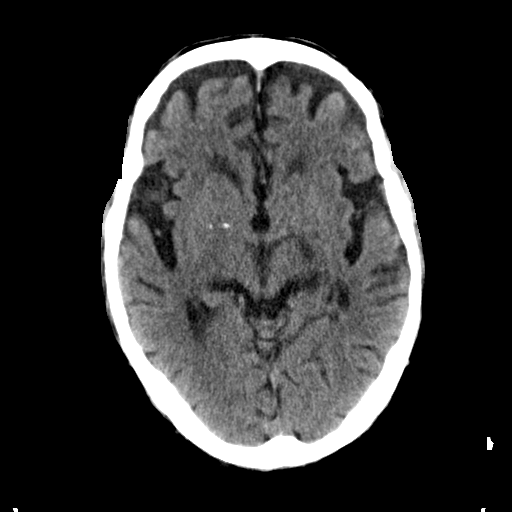
[im 17/36  brain]
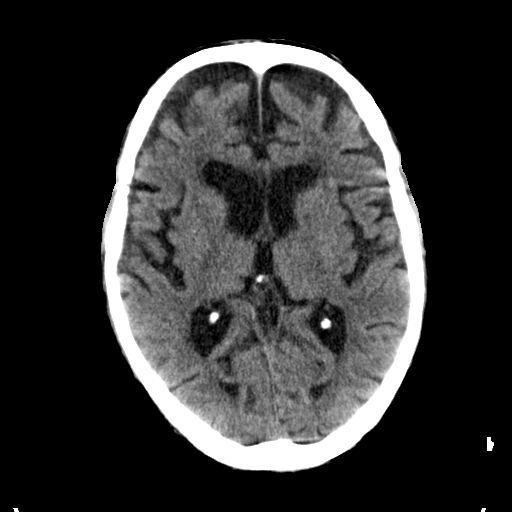
[im 19/36  brain]
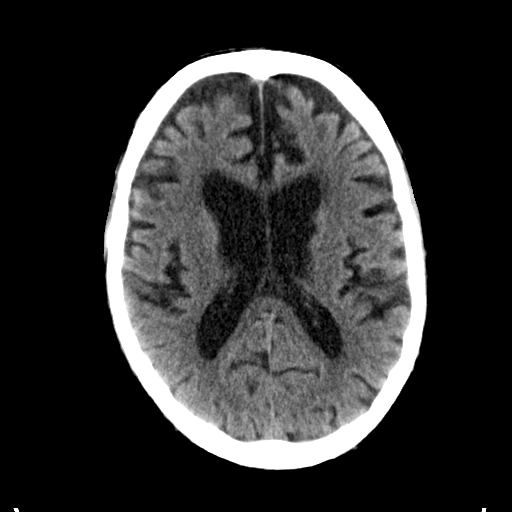
[im 22/36  brain]
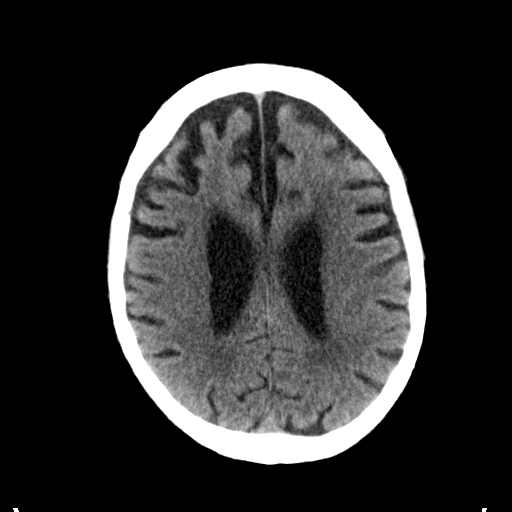
[im 22/36  bone]
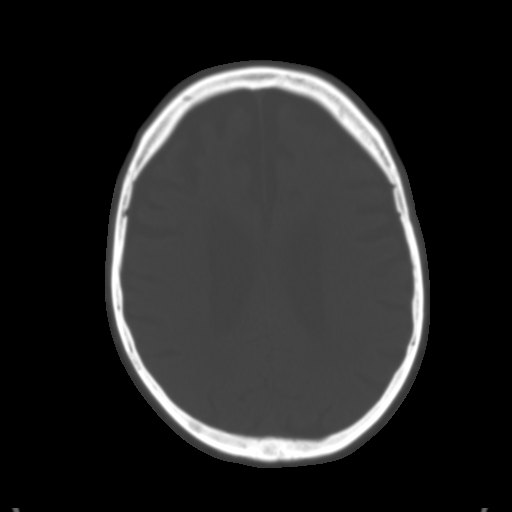
[im 24/36  brain]
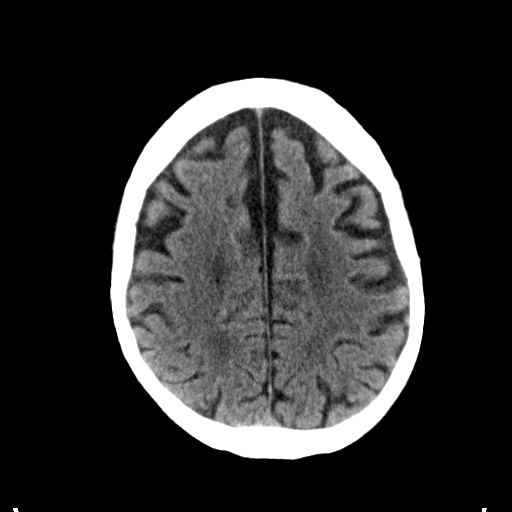
[im 27/36  brain]
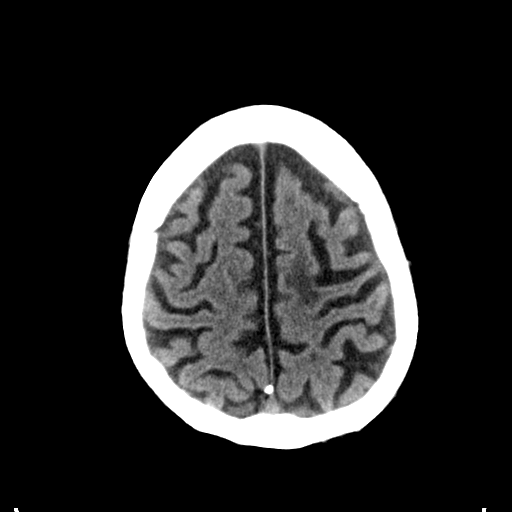
[im 29/36  brain]
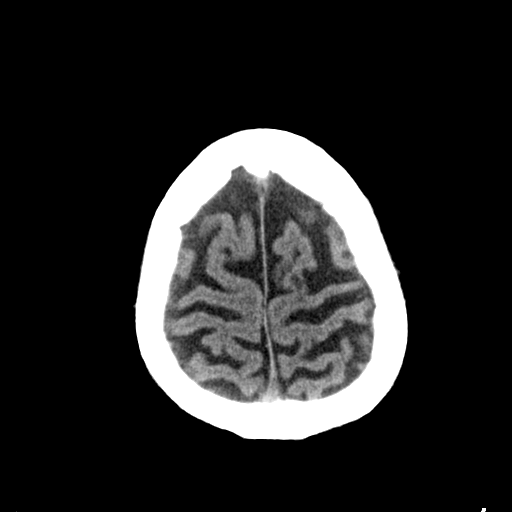
[im 31/36  brain]
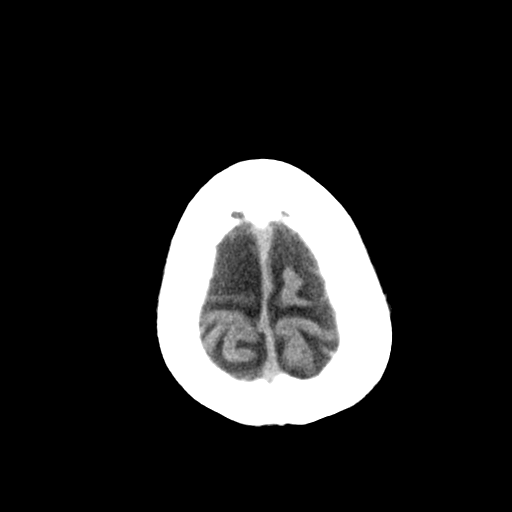
[im 31/36  bone]
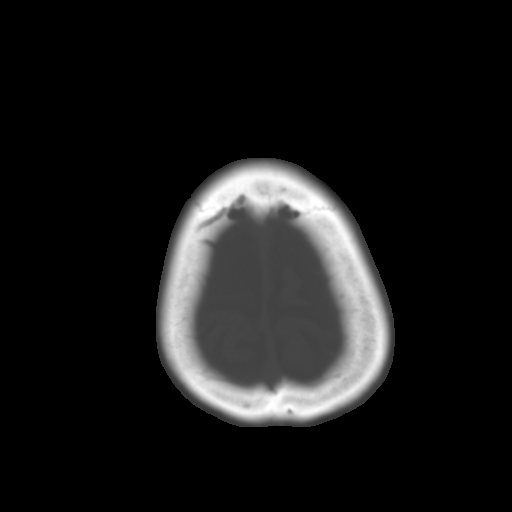
[im 34/36  brain]
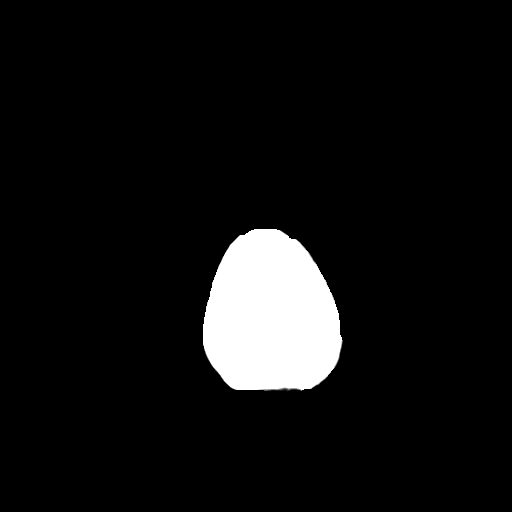

[14 of 30 positions shown; findings below may reference images not displayed]

FINDINGS: Diffusely decreased attenuation within the left cerebellar
hemisphere appears largely new from the prior study from raising
suspicion for an evolving large left cerebellar infarct. There is no
evidence of hemorrhagic transformation at this time. Associated
mass-effect is seen, with likely 6 mm of rightward midline shift at
the level of the cerebellum. There is slight partial effacement of
the fourth ventricle. The pons and cerebral peduncles are grossly
unremarkable in appearance. Surrounding cisterns appear intact,
without definite evidence of transtentorial herniation.

Prominence of the ventricles and sulci reflects moderate cortical
volume loss. Cerebellar atrophy is noted. Scattered periventricular
and subcortical white matter change likely reflects small vessel
ischemic microangiopathy. Small chronic lacunar infarcts are seen
within the right cerebellar hemisphere. A small focus of
encephalomalacia at the high left parietal lobe likely reflects
remote infarct. The basal ganglia are unremarkable in appearance.

There is no evidence of fracture; visualized osseous structures are
unremarkable in appearance. The orbits are within normal limits. The
paranasal sinuses and mastoid air cells are well-aerated. No
significant soft tissue abnormalities are seen.
IMPRESSION: 1. Acute evolving large left cerebellar infarct noted, with
associated mass effect and likely 6 mm of rightward midline shift at
the level of the cerebellum. Slight partial effacement of the fourth
ventricle. Given underlying volume loss, the pons, cerebral
peduncles and surrounding cisterns are grossly unremarkable, without
definite evidence of transtentorial herniation. No definite evidence
of hemorrhagic transformation at this time.
2. Moderate cortical volume loss and scattered small vessel ischemic
microangiopathy.
3. Small chronic lacunar infarcts within the right cerebellar
hemisphere, and small focus of encephalomalacia at the high left
parietal lobe, reflecting remote infarct.
These results were called by telephone at the time of interpretation
on 02/16/2013 at [DATE] to Dr. SOTERO SHELBY , who verbally
acknowledged these results.
